# Patient Record
Sex: Female | Born: 1990
Health system: Southern US, Community
[De-identification: ages and names within clinical notes are randomized; demographics above are authoritative.]

## PROBLEM LIST (undated history)

## (undated) ENCOUNTER — Inpatient Hospital Stay (HOSPITAL_COMMUNITY): Payer: Self-pay

## (undated) DIAGNOSIS — K5900 Constipation, unspecified: Secondary | ICD-10-CM

## (undated) DIAGNOSIS — F419 Anxiety disorder, unspecified: Secondary | ICD-10-CM

## (undated) DIAGNOSIS — J45909 Unspecified asthma, uncomplicated: Secondary | ICD-10-CM

## (undated) HISTORY — PX: OTHER SURGICAL HISTORY: SHX169

## (undated) HISTORY — PX: TUBAL LIGATION: SHX77

## (undated) HISTORY — PX: HERNIA REPAIR: SHX51

## (undated) HISTORY — PX: ADENOIDECTOMY: SUR15

---
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
## 2011-02-23 ENCOUNTER — Emergency Department (HOSPITAL_COMMUNITY): Payer: No Typology Code available for payment source

## 2011-02-23 ENCOUNTER — Emergency Department (HOSPITAL_COMMUNITY)
Admission: EM | Admit: 2011-02-23 | Discharge: 2011-02-23 | Disposition: A | Discharge status: Home / Self Care | Payer: No Typology Code available for payment source | Attending: Emergency Medicine | Admitting: Emergency Medicine

## 2011-02-23 DIAGNOSIS — S52009A Unspecified fracture of upper end of unspecified ulna, initial encounter for closed fracture: Secondary | ICD-10-CM | POA: Insufficient documentation

## 2011-02-23 DIAGNOSIS — S0100XA Unspecified open wound of scalp, initial encounter: Secondary | ICD-10-CM | POA: Insufficient documentation

## 2011-02-23 DIAGNOSIS — N949 Unspecified condition associated with female genital organs and menstrual cycle: Secondary | ICD-10-CM | POA: Insufficient documentation

## 2011-02-23 DIAGNOSIS — R0789 Other chest pain: Secondary | ICD-10-CM | POA: Insufficient documentation

## 2011-02-23 DIAGNOSIS — F988 Other specified behavioral and emotional disorders with onset usually occurring in childhood and adolescence: Secondary | ICD-10-CM | POA: Insufficient documentation

## 2011-02-23 DIAGNOSIS — M79609 Pain in unspecified limb: Secondary | ICD-10-CM | POA: Insufficient documentation

## 2011-02-26 ENCOUNTER — Observation Stay (HOSPITAL_COMMUNITY)
Admission: RE | Admit: 2011-02-26 | Discharge: 2011-02-27 | Disposition: A | Discharge status: Home / Self Care | Payer: No Typology Code available for payment source | Source: Ambulatory Visit | Attending: Orthopedic Surgery | Admitting: Orthopedic Surgery

## 2011-02-26 DIAGNOSIS — S52209A Unspecified fracture of shaft of unspecified ulna, initial encounter for closed fracture: Principal | ICD-10-CM | POA: Insufficient documentation

## 2011-02-26 DIAGNOSIS — J45909 Unspecified asthma, uncomplicated: Secondary | ICD-10-CM | POA: Insufficient documentation

## 2011-02-26 DIAGNOSIS — Z01812 Encounter for preprocedural laboratory examination: Secondary | ICD-10-CM | POA: Insufficient documentation

## 2011-02-26 DIAGNOSIS — Y998 Other external cause status: Secondary | ICD-10-CM | POA: Insufficient documentation

## 2011-02-26 LAB — DIFFERENTIAL
Basophils Absolute: 0 10*3/uL (ref 0.0–0.1)
Basophils Relative: 0 % (ref 0–1)
Eosinophils Relative: 6 % — ABNORMAL HIGH (ref 0–5)
Lymphocytes Relative: 36 % (ref 12–46)
Monocytes Absolute: 0.7 10*3/uL (ref 0.1–1.0)

## 2011-02-26 LAB — CBC
HCT: 33.5 % — ABNORMAL LOW (ref 36.0–46.0)
MCHC: 34.3 g/dL (ref 30.0–36.0)
Platelets: 230 10*3/uL (ref 150–400)
RDW: 12.3 % (ref 11.5–15.5)
WBC: 7.7 10*3/uL (ref 4.0–10.5)

## 2011-02-26 LAB — COMPREHENSIVE METABOLIC PANEL
ALT: 22 U/L (ref 0–35)
AST: 16 U/L (ref 0–37)
Albumin: 3.3 g/dL — ABNORMAL LOW (ref 3.5–5.2)
Alkaline Phosphatase: 58 U/L (ref 39–117)
BUN: 9 mg/dL (ref 6–23)
Chloride: 103 mEq/L (ref 96–112)
Potassium: 4.1 mEq/L (ref 3.5–5.1)
Sodium: 135 mEq/L (ref 135–145)
Total Bilirubin: 0.4 mg/dL (ref 0.3–1.2)
Total Protein: 5.8 g/dL — ABNORMAL LOW (ref 6.0–8.3)

## 2011-02-26 LAB — HCG, SERUM, QUALITATIVE: Preg, Serum: NEGATIVE

## 2011-03-01 NOTE — Op Note (Signed)
[name], [name] NO.:  [id number]  MEDICAL RECORD NO.:  [id number]  LOCATION:  5032                         FACILITY:  [facility]  PHYSICIAN:  Dionne Ano. Oluwatoni Rotunno, M.D.DATE OF BIRTH:  [birth year]  DATE OF PROCEDURE:  02/27/2011 DATE OF DISCHARGE:                              OPERATIVE REPORT   PREOPERATIVE DIAGNOSIS:  Displaced ulna shaft fracture, right forearm.  POSTOPERATIVE DIAGNOSIS:  Displaced ulna shaft fracture, right forearm.  PROCEDURE: 1. Open reduction and internal fixation with 8-hole plate and screw     construct ([employer] 3.5 DCP plate), right ulna shaft. 2. Stress radiography.  SURGEON:  Dionne Ano. Amanda Pea, MD  ASSISTANT:  Karie Chimera, PA-C  COMPLICATIONS:  None.  ANESTHESIA:  General.  TOURNIQUET TIME:  Less than an hour.  DRAINS:  None.  INDICATIONS FOR PROCEDURE:  A 20 year old female presents with the above- mentioned diagnosis.  She and her family understand risks and benefits of surgery and desired to proceed.  OPERATION IN DETAIL:  The patient was seen by myself and anesthesia, taken to the operative arena, underwent smooth induction of anesthesia in the form general anesthetic.  She was laid supine, appropriately padded, prepped and draped in the usual sterile fashion with Betadine scrub and paint.  Following this, she then underwent a time-out and preop checklist of course was verified multiple times.  Once this was accomplished and with body parts well padded, we performed an incision of the ulnar border.  Her fracture was located in the proximal third of the shaft.  Dissection was carried down.  Interval between the ECU and FCU was created.  I very carefully mobilized the fracture.  She had a butterfly fragment.  I also very carefully curettaged the fracture site, irrigated it copiously, and then held a plate which I bent with clamps against the surface.  The patient had anatomic reduction and then had the plate applied  in compression mold.  Compression mode was applied and the plate was placed without difficulty.  AP, lateral, and multiple fluoroscopic views were taken, wrist and elbow x-rays were perfect with good alignment of the proximal and distal radioulnar joints.  Excellent fixation was accomplished.  Good, smooth pronation and supination was noted and final copy x-rays were confirmed.  The patient tolerated this well with no complicating features.  Following this, I then irrigated copiously with copious amounts of saline followed by closure of the interval between the ECU and FCU with 3-0 Vicryl and the subcu was closed with 3-0 Vicryl and skin edges were closed with subcuticular Prolene with Steri-Strips.  20 mL of Sensorcaine were placed without difficulty and the patient was placed in long-arm splint.  The patient tolerated this well and there were no complicating features.  She was taken to recovery room in stable condition.  We will continue IV vancomycin given a cephalosporin allergy and close observatory care.  I discussed with the patient do's and dont's, etc., and all questions have been encouraged and answered.  Her parents were very well aware of the plans, risks, and benefits, do's and dont's, and follow up protocol.  This was uncomplicated ORIF.  She tolerated this well with an 8-hole  plate and screw construct from [employer] which created anatomical reduction of this displaced fracture.  She will be admitted overnight and discharged in the morning.     Dionne Ano. Amanda Pea, M.D.     [facility]  D:  02/27/2011  T:  02/27/2011  Job:  [id number]  Electronically Signed by Dominica Severin M.D. on 03/01/2011 07:42:11 AM

## 2011-03-04 ENCOUNTER — Ambulatory Visit (HOSPITAL_COMMUNITY)
Admission: RE | Admit: 2011-03-04 | Discharge: 2011-03-04 | Disposition: A | Discharge status: Home / Self Care | Payer: No Typology Code available for payment source | Source: Ambulatory Visit | Attending: Orthopedic Surgery | Admitting: Orthopedic Surgery

## 2011-03-04 ENCOUNTER — Other Ambulatory Visit (HOSPITAL_COMMUNITY): Payer: Self-pay | Admitting: Orthopedic Surgery

## 2011-03-04 DIAGNOSIS — R51 Headache: Secondary | ICD-10-CM | POA: Insufficient documentation

## 2011-03-04 DIAGNOSIS — M549 Dorsalgia, unspecified: Secondary | ICD-10-CM | POA: Insufficient documentation

## 2011-03-04 DIAGNOSIS — R109 Unspecified abdominal pain: Secondary | ICD-10-CM | POA: Insufficient documentation

## 2011-03-04 MED ORDER — IOHEXOL 300 MG/ML  SOLN
80.0000 mL | Freq: Once | INTRAMUSCULAR | Status: AC | PRN
  Administered 2011-03-04: 80 mL via INTRAVENOUS

## 2011-03-06 ENCOUNTER — Other Ambulatory Visit (HOSPITAL_COMMUNITY): Payer: No Typology Code available for payment source

## 2011-05-25 ENCOUNTER — Ambulatory Visit: Payer: 59 | Attending: Orthopedic Surgery | Admitting: Physical Therapy

## 2011-05-25 DIAGNOSIS — IMO0001 Reserved for inherently not codable concepts without codable children: Secondary | ICD-10-CM | POA: Insufficient documentation

## 2011-05-25 DIAGNOSIS — R5381 Other malaise: Secondary | ICD-10-CM | POA: Insufficient documentation

## 2011-05-25 DIAGNOSIS — M25539 Pain in unspecified wrist: Secondary | ICD-10-CM | POA: Insufficient documentation

## 2011-05-27 ENCOUNTER — Ambulatory Visit: Payer: No Typology Code available for payment source | Admitting: Physical Therapy

## 2011-06-01 ENCOUNTER — Ambulatory Visit: Payer: 59 | Admitting: Physical Therapy

## 2011-06-08 ENCOUNTER — Encounter: Payer: No Typology Code available for payment source | Admitting: Physical Therapy

## 2011-06-09 ENCOUNTER — Ambulatory Visit: Payer: 59 | Attending: Orthopedic Surgery | Admitting: Physical Therapy

## 2011-06-09 DIAGNOSIS — IMO0001 Reserved for inherently not codable concepts without codable children: Secondary | ICD-10-CM | POA: Insufficient documentation

## 2011-06-09 DIAGNOSIS — M25539 Pain in unspecified wrist: Secondary | ICD-10-CM | POA: Insufficient documentation

## 2011-06-09 DIAGNOSIS — R5381 Other malaise: Secondary | ICD-10-CM | POA: Insufficient documentation

## 2011-06-15 ENCOUNTER — Encounter: Payer: No Typology Code available for payment source | Admitting: Physical Therapy

## 2011-06-16 ENCOUNTER — Ambulatory Visit: Payer: 59 | Admitting: *Deleted

## 2011-06-24 ENCOUNTER — Ambulatory Visit: Payer: 59 | Admitting: Physical Therapy

## 2011-07-03 ENCOUNTER — Ambulatory Visit: Payer: 59 | Admitting: Physical Therapy

## 2012-08-29 IMAGING — CR DG ABDOMEN 1V
2 series · 2 of 2 positions shown · non-contrast
Comparison: None.

CLINICAL DATA: Constipation.  Sitz marker study.

ABDOMEN - 1 VIEW

[t abdomen supine (1 of 2)]
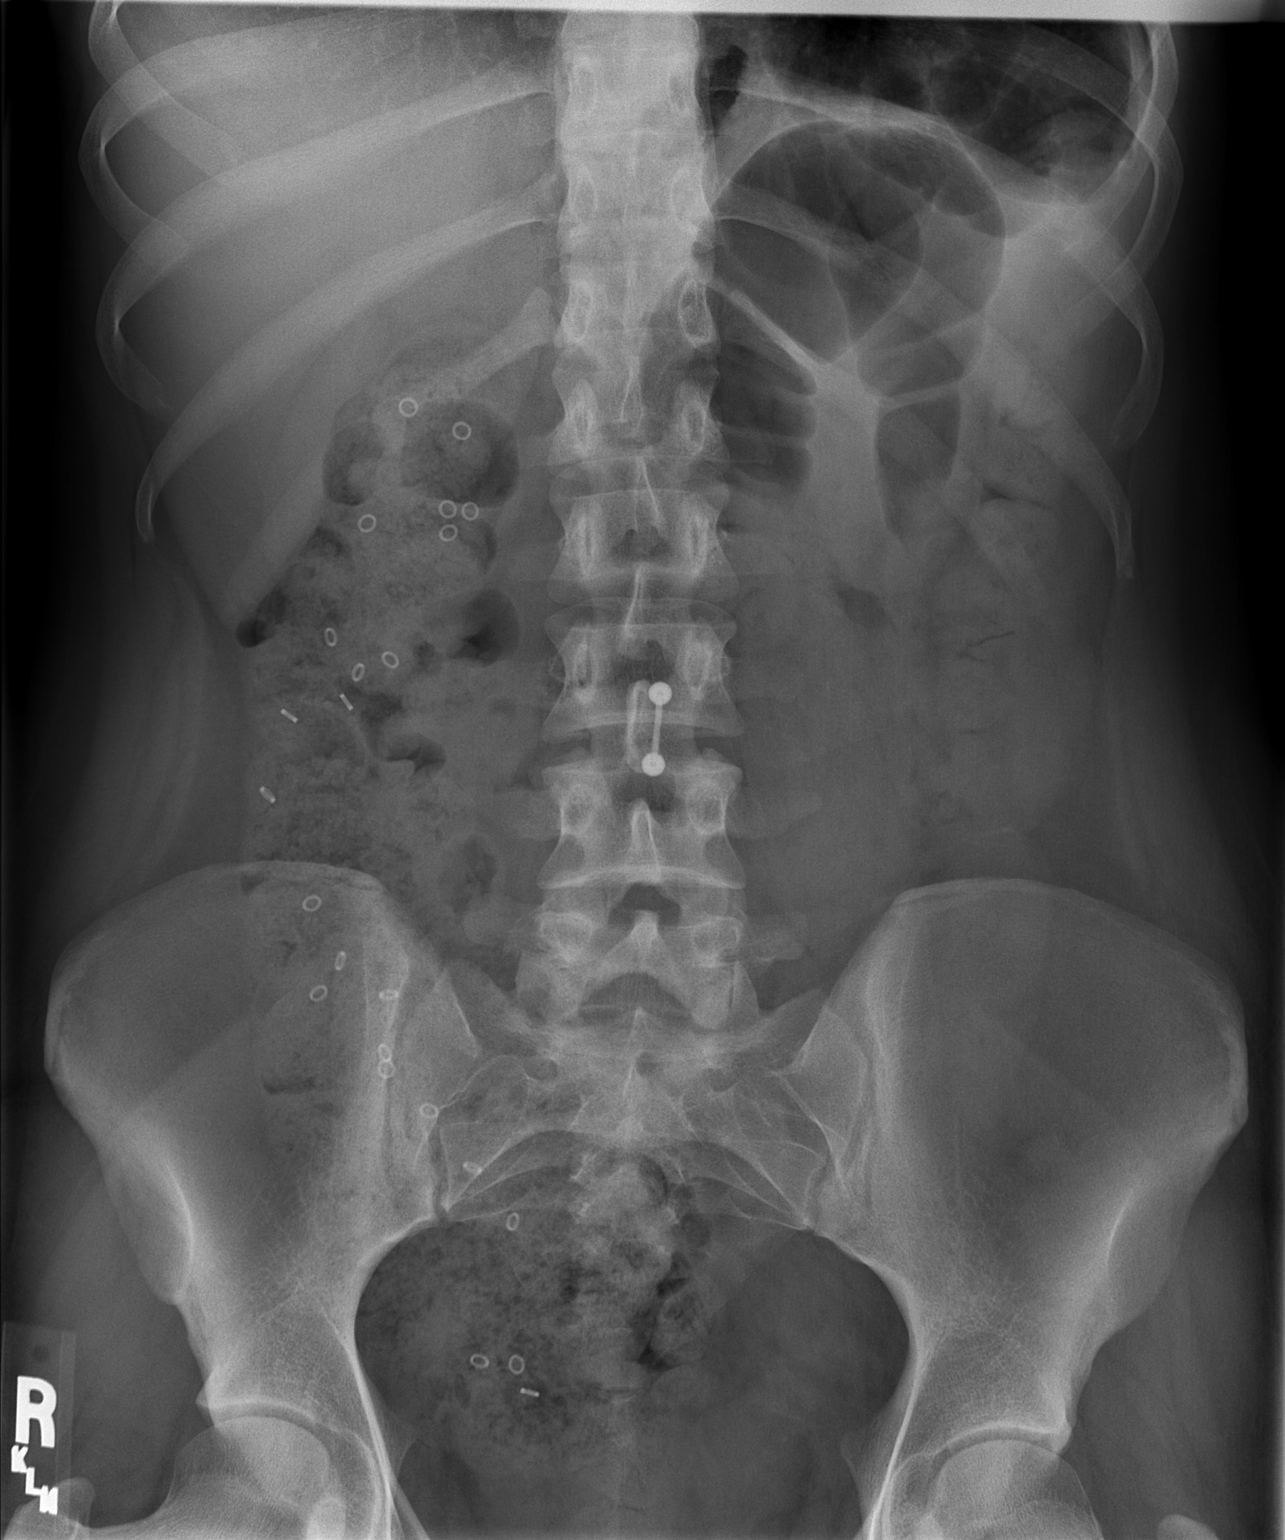

[t abdomen supine (2 of 2)]
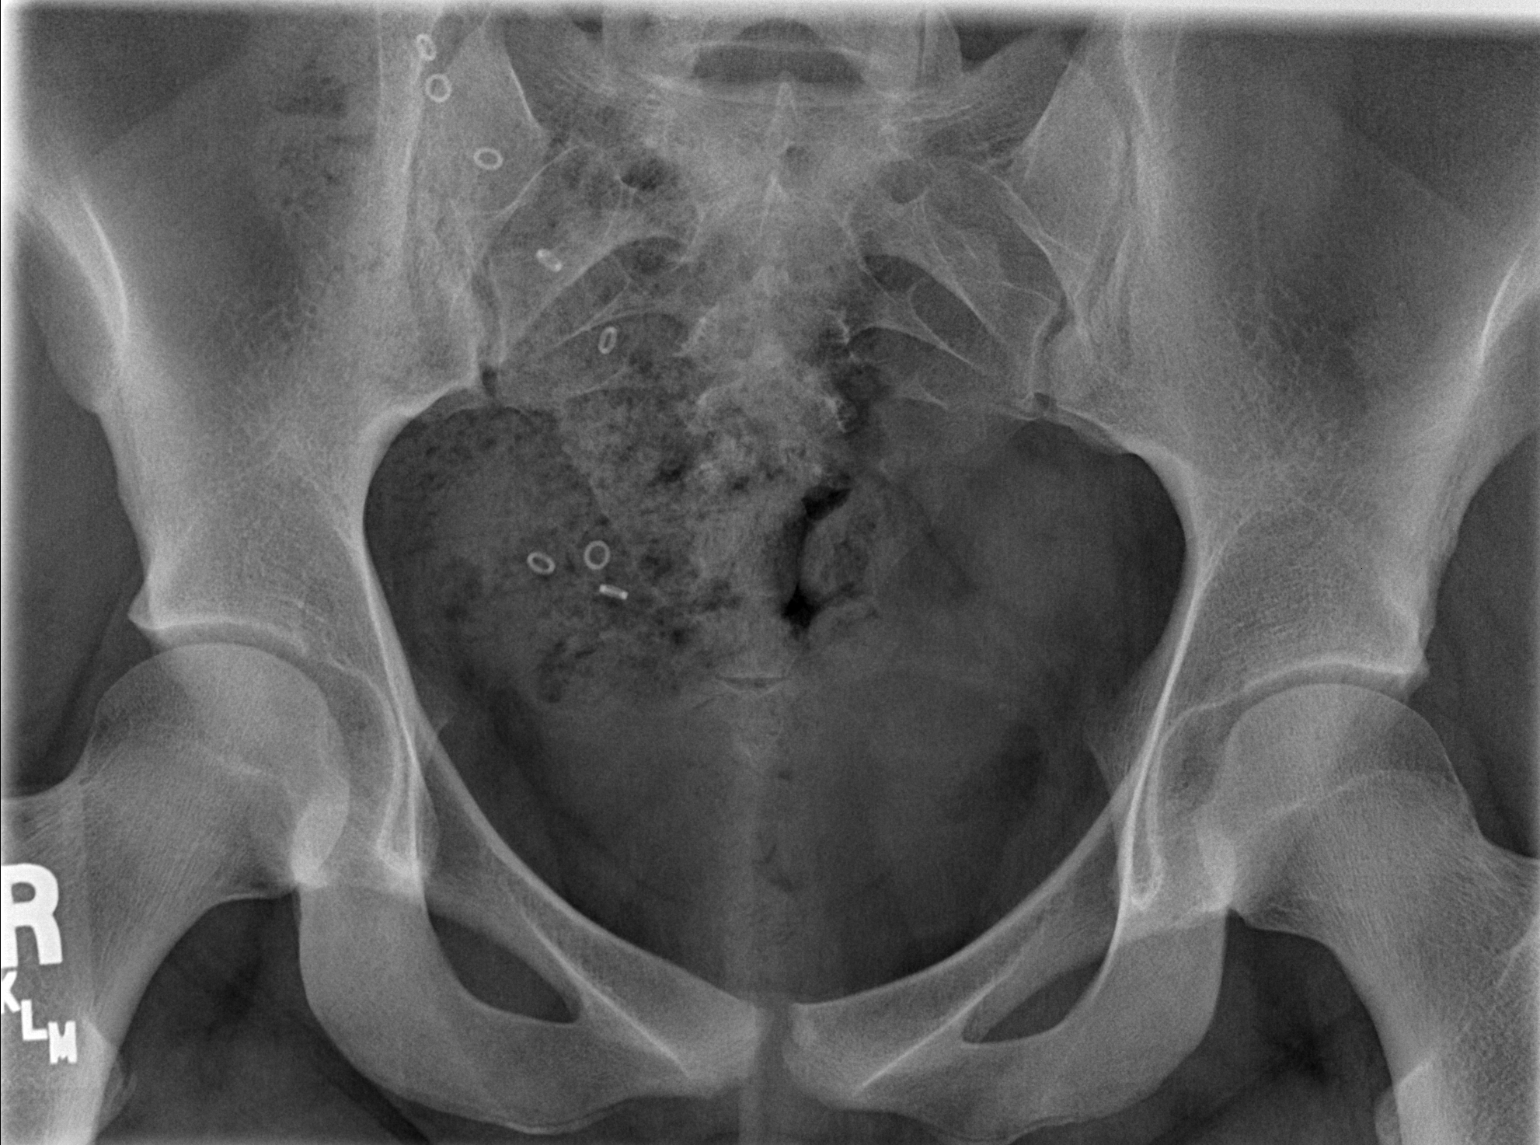

[2 of 2 positions shown; findings below may reference images not displayed]

FINDINGS: Supine abdomen shows 24 sitz markers in the right colon,
extending from the cecal tip to the hepatic flexure.
IMPRESSION: 24 sitz markers visible in the right colon on day two.

## 2013-05-29 ENCOUNTER — Ambulatory Visit: Payer: 59 | Attending: Neurological Surgery | Admitting: Physical Therapy

## 2013-05-29 DIAGNOSIS — M545 Low back pain, unspecified: Secondary | ICD-10-CM | POA: Insufficient documentation

## 2013-05-29 DIAGNOSIS — IMO0001 Reserved for inherently not codable concepts without codable children: Secondary | ICD-10-CM | POA: Insufficient documentation

## 2013-05-29 DIAGNOSIS — R293 Abnormal posture: Secondary | ICD-10-CM | POA: Insufficient documentation

## 2013-05-29 DIAGNOSIS — R5381 Other malaise: Secondary | ICD-10-CM | POA: Insufficient documentation

## 2013-05-30 ENCOUNTER — Ambulatory Visit: Payer: 59 | Admitting: Physical Therapy

## 2013-06-06 ENCOUNTER — Ambulatory Visit: Payer: 59 | Attending: Neurological Surgery | Admitting: Physical Therapy

## 2013-06-06 DIAGNOSIS — R293 Abnormal posture: Secondary | ICD-10-CM | POA: Insufficient documentation

## 2013-06-06 DIAGNOSIS — M545 Low back pain, unspecified: Secondary | ICD-10-CM | POA: Insufficient documentation

## 2013-06-06 DIAGNOSIS — IMO0001 Reserved for inherently not codable concepts without codable children: Secondary | ICD-10-CM | POA: Insufficient documentation

## 2013-06-06 DIAGNOSIS — R5381 Other malaise: Secondary | ICD-10-CM | POA: Insufficient documentation

## 2013-06-12 ENCOUNTER — Ambulatory Visit: Payer: 59 | Admitting: Physical Therapy

## 2013-10-18 ENCOUNTER — Ambulatory Visit (INDEPENDENT_AMBULATORY_CARE_PROVIDER_SITE_OTHER): Payer: 59

## 2013-10-18 VITALS — BP 119/80 | HR 80 | Resp 18

## 2013-10-18 DIAGNOSIS — L600 Ingrowing nail: Secondary | ICD-10-CM

## 2013-10-18 DIAGNOSIS — L03039 Cellulitis of unspecified toe: Secondary | ICD-10-CM

## 2013-10-18 MED ORDER — HYDROCODONE-ACETAMINOPHEN 5-325 MG PO TABS: 1.0000 | ORAL_TABLET | Freq: Four times a day (QID) | ORAL | Status: DC | PRN

## 2013-10-18 MED ORDER — SULFAMETHOXAZOLE-TMP DS 800-160 MG PO TABS: 1.0000 | ORAL_TABLET | Freq: Two times a day (BID) | ORAL | Status: DC

## 2013-10-18 NOTE — Patient Instructions (Signed)

## 2013-10-18 NOTE — Progress Notes (Signed)
   Subjective:    Patient ID: Angel [name], female    DOB: 15-Jul-1990, 23 y.o.   MRN: [id number]  HPI my right big toenail is red and draining and is sore and tender and I have not soaking it and I went to my medical doctor about a week ago and they gave me an ointment and to soak it and this is been going on for about 6 months and I do get them quite often    Review of Systems  All other systems reviewed and are negative.      Objective:   Physical Exam Neurovascular status is intact with pedal pulses palpable DP and PT posterior were for Refill timed 3-4 seconds all digits skin temperature warm turgor normal no edema rubor varicosity edema and erythema and drainage lateral nail fold of the right great toe going on for several weeks has been on antibiotics in the past. Has had nail procedure some point in the past with temporary cut out was done without permanent nail excision. Orthopedic biomechanical exam unremarkable noncontributory rectus foot type no signs of other infection open wounds or ulcerations noted hallux and lesser digits are rectus.       Assessment & Plan:  Assessment this time is paronychia ingrown nail right hallux lateral border medial border is also ingrowing. This time recommendation permanent nail excision and phenol matricectomy medial lateral border of the right hallux local anesthetic block sinister total of 6 cc 50-50 mixture of 2% Xylocaine plain and 0.5% Marcaine plain were utilized Betadine prep was performed the borders were excised phenol matricectomy followed by alcohol wash Betadine ointment and a dry sterile dressing being applied. Patient is given instructions for daily soap and soapy temperature soap and warm water or Epsom salts in warm water recommended Neosporin and Band-Aid dressing daily also prescription for Septra DS and prescription for Vicodin are dispensed followup in 2 weeks for nail check next  Alvan Dameichard Draken Farrior DPM

## 2013-11-03 ENCOUNTER — Ambulatory Visit (INDEPENDENT_AMBULATORY_CARE_PROVIDER_SITE_OTHER): Payer: 59

## 2013-11-03 VITALS — BP 106/66 | HR 72 | Resp 12

## 2013-11-03 DIAGNOSIS — L600 Ingrowing nail: Secondary | ICD-10-CM

## 2013-11-03 NOTE — Progress Notes (Signed)
   Subjective:    Patient ID: Angel [name], female    DOB: 03-11-1991, 23 y.o.   MRN: [id number]  HPI patient presents this time for 2 week followup status post AP nail procedure    Review of Systems new systemic changes or findings noted     Objective:   Physical Exam Neurovascular status is intact pedal pulses palpable. Epicritic and proprioceptive sensations intact there is slight eschar medial lateral nail borders right great toe no discharge or drainage noted slight erythema. No pain or discomfort       Assessment & Plan:  Assessment good postop progress following AP nail right great toe medial lateral border maintain Neosporin and Band-Aid during Rachell dry night discharge to an as-needed basis for any future followup  Alvan Dameichard Sonam Huelsmann DPM

## 2013-11-03 NOTE — Patient Instructions (Signed)

## 2014-10-15 ENCOUNTER — Other Ambulatory Visit: Payer: Self-pay | Admitting: Obstetrics and Gynecology

## 2014-10-15 DIAGNOSIS — N63 Unspecified lump in unspecified breast: Secondary | ICD-10-CM

## 2014-10-15 DIAGNOSIS — N6320 Unspecified lump in the left breast, unspecified quadrant: Secondary | ICD-10-CM

## 2014-10-15 DIAGNOSIS — N6325 Unspecified lump in the left breast, overlapping quadrants: Secondary | ICD-10-CM

## 2014-10-19 ENCOUNTER — Ambulatory Visit
Admission: RE | Admit: 2014-10-19 | Discharge: 2014-10-19 | Disposition: A | Discharge status: Home / Self Care | Payer: 59 | Source: Ambulatory Visit | Attending: Obstetrics and Gynecology | Admitting: Obstetrics and Gynecology

## 2014-10-19 DIAGNOSIS — N6325 Unspecified lump in the left breast, overlapping quadrants: Secondary | ICD-10-CM

## 2014-10-19 DIAGNOSIS — N63 Unspecified lump in unspecified breast: Secondary | ICD-10-CM

## 2014-10-19 DIAGNOSIS — N6320 Unspecified lump in the left breast, unspecified quadrant: Secondary | ICD-10-CM

## 2015-01-09 ENCOUNTER — Encounter (HOSPITAL_BASED_OUTPATIENT_CLINIC_OR_DEPARTMENT_OTHER): Payer: Self-pay

## 2015-01-09 ENCOUNTER — Emergency Department (HOSPITAL_BASED_OUTPATIENT_CLINIC_OR_DEPARTMENT_OTHER)
Admission: EM | Admit: 2015-01-09 | Discharge: 2015-01-09 | Disposition: A | Discharge status: Home / Self Care | Payer: 59 | Attending: Emergency Medicine | Admitting: Emergency Medicine

## 2015-01-09 DIAGNOSIS — W1839XA Other fall on same level, initial encounter: Secondary | ICD-10-CM | POA: Insufficient documentation

## 2015-01-09 DIAGNOSIS — M5441 Lumbago with sciatica, right side: Secondary | ICD-10-CM | POA: Insufficient documentation

## 2015-01-09 DIAGNOSIS — S300XXA Contusion of lower back and pelvis, initial encounter: Secondary | ICD-10-CM | POA: Diagnosis not present

## 2015-01-09 DIAGNOSIS — K5900 Constipation, unspecified: Secondary | ICD-10-CM | POA: Insufficient documentation

## 2015-01-09 DIAGNOSIS — Z720 Tobacco use: Secondary | ICD-10-CM | POA: Diagnosis not present

## 2015-01-09 DIAGNOSIS — Z79899 Other long term (current) drug therapy: Secondary | ICD-10-CM | POA: Insufficient documentation

## 2015-01-09 DIAGNOSIS — Y9289 Other specified places as the place of occurrence of the external cause: Secondary | ICD-10-CM | POA: Diagnosis not present

## 2015-01-09 DIAGNOSIS — J45909 Unspecified asthma, uncomplicated: Secondary | ICD-10-CM | POA: Diagnosis not present

## 2015-01-09 DIAGNOSIS — Y9389 Activity, other specified: Secondary | ICD-10-CM | POA: Diagnosis not present

## 2015-01-09 DIAGNOSIS — S3992XA Unspecified injury of lower back, initial encounter: Secondary | ICD-10-CM | POA: Diagnosis present

## 2015-01-09 DIAGNOSIS — Y998 Other external cause status: Secondary | ICD-10-CM | POA: Diagnosis not present

## 2015-01-09 HISTORY — DX: Unspecified asthma, uncomplicated: J45.909

## 2015-01-09 HISTORY — DX: Constipation, unspecified: K59.00

## 2015-01-09 MED ORDER — NAPROXEN 500 MG PO TABS: 500.0000 mg | ORAL_TABLET | Freq: Two times a day (BID) | ORAL | Status: DC

## 2015-01-09 MED ORDER — METHOCARBAMOL 500 MG PO TABS: 1000.0000 mg | ORAL_TABLET | Freq: Four times a day (QID) | ORAL | Status: DC

## 2015-01-09 NOTE — ED Provider Notes (Signed)
CSN: [id number]     Arrival date & time 01/09/15  1105 History   First MD Initiated Contact with Patient 01/09/15 1202     Chief Complaint  Patient presents with  . Fall     (Consider location/radiation/quality/duration/timing/severity/associated sxs/prior Treatment) HPI Comments: Patient presents with complaint of back pain with radiation into her right leg after a fall yesterday. Patient states that she fell onto a trailer impacting in the area of her right lower back. She had intense pain and developed a bruise. She was able to walk but was in a lot of pain yesterday. Pain is slightly better today and she is moving better. She complains of right-sided sciatica. She has had pain similar to this before but not this severe. Patient denies warning symptoms of back pain including: fecal incontinence, urinary retention or overflow incontinence, night sweats, waking from sleep with back pain, unexplained fevers or weight loss, h/o cancer, IVDU. Patient put ice on the area and some topical medication prior to arrival. No urinary symptoms, specifically hematuria. She states that she needs a note for work today so that she is not fired. Muscle relaxers have helped her when she has had this type pain in the past. The onset of this condition was acute. The course is constant. Aggravating factors: movement. Alleviating factors: none.     The history is provided by the patient.    Past Medical History  Diagnosis Date  . Constipation   . Asthma    Past Surgical History  Procedure Laterality Date  . Arm surgery     No family history on file. History  Substance Use Topics  . Smoking status: Current Every Day Smoker -- 1.00 packs/day    Types: Cigarettes  . Smokeless tobacco: Never Used  . Alcohol Use: Yes   OB History    No data available     Review of Systems  Constitutional: Negative for fever and unexpected weight change.  Gastrointestinal: Negative for constipation.       Negative for  fecal incontinence.   Genitourinary: Negative for dysuria, hematuria, flank pain, vaginal bleeding, vaginal discharge and pelvic pain.       Negative for urinary incontinence or retention.  Musculoskeletal: Positive for back pain.  Skin: Color change: bruise.  Neurological: Negative for weakness and numbness.       Denies saddle paresthesias.      Allergies  Cefzil and Vancomycin  Home Medications   Prior to Admission medications   Medication Sig Start Date End Date Taking? Authorizing Provider  ALBUTEROL IN Inhale into the lungs.   Yes Historical Provider, MD  polyethylene glycol (MIRALAX / GLYCOLAX) packet Take 17 g by mouth daily.    Historical Provider, MD   BP 105/67 mmHg  Pulse 79  Temp(Src) 98.3 F (36.8 C) (Oral)  Resp 18  Ht 5\' 5"  (1.651 m)  Wt 180 lb (81.647 kg)  BMI 29.95 kg/m2  SpO2 95%  LMP 01/07/2015   Physical Exam  Constitutional: She appears well-developed and well-nourished.  HENT:  Head: Normocephalic and atraumatic.  Eyes: Conjunctivae are normal.  Neck: Normal range of motion. Neck supple.  Pulmonary/Chest: Effort normal.  Abdominal: Soft. There is no tenderness. There is no CVA tenderness.  Musculoskeletal: Normal range of motion.       Cervical back: She exhibits normal range of motion, no tenderness and no bony tenderness.       Thoracic back: She exhibits normal range of motion, no tenderness and no bony tenderness.  Lumbar back: She exhibits tenderness, pain and spasm. She exhibits normal range of motion and no bony tenderness.       Back:  No step-off noted with palpation of spine.   Neurological: She is alert. She has normal strength and normal reflexes. No sensory deficit.  5/5 strength in entire lower extremities bilaterally. No sensation deficit.   Skin: Skin is warm and dry. No rash noted.  Psychiatric: She has a normal mood and affect.  Nursing note and vitals reviewed.   ED Course  Procedures (including critical care  time) Labs Review Labs Reviewed - No data to display  Imaging Review No results found.   EKG Interpretation None       12:20 PM Patient seen and examined.   Vital signs reviewed and are as follows: BP 105/67 mmHg  Pulse 79  Temp(Src) 98.3 F (36.8 C) (Oral)  Resp 18  Ht  (1.651 m)  Wt 180 lb (81.647 kg)  BMI 29.95 kg/m2  SpO2 95%  LMP 01/07/2015  No red flag s/s of low back pain. Patient was counseled on back pain precautions and told to do activity as tolerated but do not lift, push, or pull heavy objects more than 10 pounds for the next week.  Patient counseled to use ice or heat on back for no longer than 15 minutes every hour.   Patient prescribed muscle relaxer and counseled on proper use of muscle relaxant medication.    Urged patient not to drink alcohol, drive, or perform any other activities that requires focus while taking this medication.  Patient urged to follow-up with PCP if pain does not improve with treatment and rest or if pain becomes recurrent. Urged to return with worsening severe pain, loss of bowel or bladder control, trouble walking.   The patient verbalizes understanding and agrees with the plan.   MDM   Final diagnoses:  Contusion of lower back, initial encounter  Right-sided low back pain with right-sided sciatica   Patient with back pain after a fall. No neurological deficits. No bony tenderness.Superficial bruise noted. No other concerning symptoms/findings for retroperitoneal hematoma. Patient is ambulatory. No warning symptoms of back pain including: fecal incontinence, urinary retention or overflow incontinence, night sweats, waking from sleep with back pain, unexplained fevers or weight loss, h/o cancer, IVDU, recent trauma. No concern for cauda equina, epidural abscess, or other serious cause of back pain. Conservative measures such as rest, ice/heat and pain medicine indicated with PCP follow-up if no improvement with conservative  management.      Renne Crigler, PA-C 01/09/15 1311  Blake Divine, MD 01/09/15 [phone]

## 2015-01-09 NOTE — ED Notes (Signed)
Pt states she fell onto trailer last night-pain to right flank and right leg-upon arrival to triage pt requesting RTW note to be faxed to employer-pt advised if EDP feels she needs to out of work a paper copy of note will be given at d/c

## 2015-01-09 NOTE — ED Notes (Signed)
Pt ambulating around ER with quick, steady gait and in NAD. Pt asked if she could go smoke. I advised her that she was getting ready to be taken to a room and if she left, she would be skipped over. Pt left ER anyway.

## 2015-01-09 NOTE — Discharge Instructions (Signed)
Please read and follow all provided instructions.  Your diagnoses today include:  1. Contusion of lower back, initial encounter   2. Right-sided low back pain with right-sided sciatica     Tests performed today include:  Vital signs - see below for your results today  Medications prescribed:   Robaxin (methocarbamol) - muscle relaxer medication  DO NOT drive or perform any activities that require you to be awake and alert because this medicine can make you drowsy.    Naproxen - anti-inflammatory pain medication  Do not exceed 500mg  naproxen every 12 hours, take with food  You have been prescribed an anti-inflammatory medication or NSAID. Take with food. Take smallest effective dose for the shortest duration needed for your pain. Stop taking if you experience stomach pain or vomiting.   Take any prescribed medications only as directed.  Home care instructions:   Follow any educational materials contained in this packet  Please rest, use ice or heat on your back for the next several days  Do not lift, push, pull anything more than 10 pounds for the next week  Follow-up instructions: Please follow-up with your primary care provider in the next 1 week for further evaluation of your symptoms.   Return instructions:  SEEK IMMEDIATE MEDICAL ATTENTION IF YOU HAVE:  New numbness, tingling, weakness, or problem with the use of your arms or legs  Severe back pain not relieved with medications  Loss control of your bowels or bladder  Increasing pain in any areas of the body (such as chest or abdominal pain)  Shortness of breath, dizziness, or fainting.   Worsening nausea (feeling sick to your stomach), vomiting, fever, or sweats  Any other emergent concerns regarding your health   Additional Information:  Your vital signs today were: BP 105/67 mmHg   Pulse 79   Temp(Src) 98.3 F (36.8 C) (Oral)   Resp 18   Ht 5\' 5"  (1.651 m)   Wt 180 lb (81.647 kg)   BMI 29.95 kg/m2    SpO2 95%   LMP 01/07/2015 If your blood pressure (BP) was elevated above 135/85 this visit, please have this repeated by your doctor within one month. --------------

## 2015-05-04 ENCOUNTER — Emergency Department (HOSPITAL_COMMUNITY)
Admission: EM | Admit: 2015-05-04 | Discharge: 2015-05-04 | Disposition: A | Discharge status: Home / Self Care | Payer: 59 | Attending: Emergency Medicine | Admitting: Emergency Medicine

## 2015-05-04 ENCOUNTER — Emergency Department (HOSPITAL_COMMUNITY): Payer: 59

## 2015-05-04 ENCOUNTER — Encounter (HOSPITAL_COMMUNITY): Payer: Self-pay | Admitting: Family Medicine

## 2015-05-04 DIAGNOSIS — S61313A Laceration without foreign body of left middle finger with damage to nail, initial encounter: Secondary | ICD-10-CM | POA: Diagnosis present

## 2015-05-04 DIAGNOSIS — Z79899 Other long term (current) drug therapy: Secondary | ICD-10-CM | POA: Insufficient documentation

## 2015-05-04 DIAGNOSIS — Z791 Long term (current) use of non-steroidal anti-inflammatories (NSAID): Secondary | ICD-10-CM | POA: Diagnosis not present

## 2015-05-04 DIAGNOSIS — Z87891 Personal history of nicotine dependence: Secondary | ICD-10-CM | POA: Insufficient documentation

## 2015-05-04 DIAGNOSIS — Y9289 Other specified places as the place of occurrence of the external cause: Secondary | ICD-10-CM | POA: Insufficient documentation

## 2015-05-04 DIAGNOSIS — S61219A Laceration without foreign body of unspecified finger without damage to nail, initial encounter: Secondary | ICD-10-CM

## 2015-05-04 DIAGNOSIS — Y9389 Activity, other specified: Secondary | ICD-10-CM | POA: Insufficient documentation

## 2015-05-04 DIAGNOSIS — J45909 Unspecified asthma, uncomplicated: Secondary | ICD-10-CM | POA: Diagnosis not present

## 2015-05-04 DIAGNOSIS — Y990 Civilian activity done for income or pay: Secondary | ICD-10-CM | POA: Insufficient documentation

## 2015-05-04 DIAGNOSIS — W260XXA Contact with knife, initial encounter: Secondary | ICD-10-CM | POA: Insufficient documentation

## 2015-05-04 DIAGNOSIS — K5900 Constipation, unspecified: Secondary | ICD-10-CM | POA: Diagnosis not present

## 2015-05-04 MED ORDER — ACETAMINOPHEN 325 MG PO TABS
650.0000 mg | ORAL_TABLET | Freq: Once | ORAL | Status: AC
  Administered 2015-05-04: 650 mg via ORAL
  Filled 2015-05-04: qty 2

## 2015-05-04 MED ORDER — BACITRACIN ZINC 500 UNIT/GM EX OINT
TOPICAL_OINTMENT | Freq: Two times a day (BID) | CUTANEOUS | Status: DC
  Filled 2015-05-04: qty 0.9

## 2015-05-04 NOTE — Discharge Instructions (Signed)
Clean the wound with warm water and antibacterial soap. Apply neosporin ointment and dressing twice daily. Return for worsening symptoms. Take tylenol for pain.

## 2015-05-04 NOTE — ED Provider Notes (Signed)
CSN: [id number]     Arrival date & time 05/04/15  1501 History   First MD Initiated Contact with Patient 05/04/15 1517     Chief Complaint  Patient presents with  . Laceration     (Consider location/radiation/quality/duration/timing/severity/associated sxs/prior Treatment) Patient is a 24 y.o. female presenting with skin laceration. The history is provided by the patient.  Laceration Location:  Hand Hand laceration location:  L finger Quality: avulsion   Bleeding: controlled   Time since incident:  2 hours Laceration mechanism:  Knife Pain details:    Quality:  Sharp and throbbing   Severity:  Moderate   Timing:  Constant   Progression:  Worsening Relieved by:  None tried Worsened by:  Movement Ineffective treatments:  None tried Tetanus status:  Up to date  Angel [name] is a 24 y.o. female who presents to the ED with a laceration to the left middle finger. She reports using a slicer at work and cut her finger.  Past Medical History  Diagnosis Date  . Constipation   . Asthma    Past Surgical History  Procedure Laterality Date  . Arm surgery     History reviewed. No pertinent family history. Social History  Substance Use Topics  . Smoking status: Former Smoker -- 1.00 packs/day    Types: Cigarettes  . Smokeless tobacco: Never Used  . Alcohol Use: No   OB History    Gravida Para Term Preterm AB TAB SAB Ectopic Multiple Living   1              Review of Systems  Skin:       Laceration finger   All other systems negative   Allergies  Cefzil and Vancomycin  Home Medications   Prior to Admission medications   Medication Sig Start Date End Date Taking? Authorizing Provider  ALBUTEROL IN Inhale into the lungs.    Historical Provider, MD  methocarbamol (ROBAXIN) 500 MG tablet Take 2 tablets (1,000 mg total) by mouth 4 (four) times daily. 01/09/15   Renne CriglerJoshua Geiple, PA-C  naproxen (NAPROSYN) 500 MG tablet Take 1 tablet (500 mg total) by mouth 2 (two) times  daily. 01/09/15   Renne CriglerJoshua Geiple, PA-C  polyethylene glycol (MIRALAX / GLYCOLAX) packet Take 17 g by mouth daily.    Historical Provider, MD   BP 98/57 mmHg  Pulse 6  Resp 20  SpO2 99%  LMP 03/07/2015 Physical Exam  Constitutional: She is oriented to person, place, and time. She appears well-developed and well-nourished.  HENT:  Head: Normocephalic and atraumatic.  Eyes: Conjunctivae and EOM are normal.  Neck: Normal range of motion. Neck supple.  Cardiovascular: Normal rate.   Pulmonary/Chest: Effort normal.  Musculoskeletal:       Left hand: She exhibits laceration. Normal strength noted.       Hands: Avulsion laceration to the dorsum of the left middle finger including the nail.   Neurological: She is alert and oriented to person, place, and time. No cranial nerve deficit.  Skin: Skin is warm and dry.  Psychiatric: She has a normal mood and affect. Her behavior is normal.  Nursing note and vitals reviewed.   ED Course  Procedures (including critical care time) Labs Review Labs Reviewed - No data to display  Imaging Review Dg Finger Middle Left  05/04/2015  CLINICAL DATA:  Laceration to tip of left middle finger today, first trimester pregnancy, patient was shielded and informed of risks EXAM: LEFT MIDDLE FINGER 2+V COMPARISON:  None.  FINDINGS: There is no evidence of fracture or dislocation. There is no evidence of arthropathy or other focal bone abnormality. Soft tissues are unremarkable. IMPRESSION: Negative. Electronically Signed   By: Esperanza Heir M.D.   On: 05/04/2015 16:15    MDM  24 y.o. female with avulsion laceration of the left middle finger that occurred at work today. Stable for d/c without focal neuro deficits. Bacitracin ointment and dressing applied. She will return as needed for any problems.   Final diagnoses:  Finger laceration, initial encounter       Janne Napoleon, NP 05/04/15 1705  Gilda Crease, MD 05/05/15 [phone]

## 2015-05-04 NOTE — ED Notes (Signed)
Pt here with laceration to middle left finger. sts she was using a slicer at work and cut finger.

## 2015-05-07 ENCOUNTER — Other Ambulatory Visit: Payer: 59

## 2015-05-08 LAB — OB RESULTS CONSOLE ABO/RH

## 2015-05-15 LAB — OB RESULTS CONSOLE RPR: RPR: NONREACTIVE

## 2015-05-15 LAB — OB RESULTS CONSOLE GC/CHLAMYDIA
Chlamydia: NEGATIVE
GC PROBE AMP, GENITAL: NEGATIVE

## 2015-05-15 LAB — OB RESULTS CONSOLE HEPATITIS B SURFACE ANTIGEN: Hepatitis B Surface Ag: NEGATIVE

## 2015-05-15 LAB — OB RESULTS CONSOLE ANTIBODY SCREEN: ANTIBODY SCREEN: NEGATIVE

## 2015-05-15 LAB — OB RESULTS CONSOLE ABO/RH: RH TYPE: POSITIVE

## 2015-05-15 LAB — OB RESULTS CONSOLE HIV ANTIBODY (ROUTINE TESTING): HIV: NONREACTIVE

## 2015-05-15 LAB — OB RESULTS CONSOLE RUBELLA ANTIBODY, IGM: Rubella: IMMUNE

## 2015-07-19 DIAGNOSIS — Z3400 Encounter for supervision of normal first pregnancy, unspecified trimester: Secondary | ICD-10-CM | POA: Diagnosis not present

## 2015-07-19 DIAGNOSIS — Z36 Encounter for antenatal screening of mother: Secondary | ICD-10-CM | POA: Diagnosis not present

## 2015-07-19 DIAGNOSIS — Z412 Encounter for routine and ritual male circumcision: Secondary | ICD-10-CM | POA: Diagnosis not present

## 2015-07-19 DIAGNOSIS — Z3A18 18 weeks gestation of pregnancy: Secondary | ICD-10-CM | POA: Diagnosis not present

## 2015-07-19 DIAGNOSIS — O30042 Twin pregnancy, dichorionic/diamniotic, second trimester: Secondary | ICD-10-CM | POA: Diagnosis not present

## 2015-08-14 DIAGNOSIS — Z3400 Encounter for supervision of normal first pregnancy, unspecified trimester: Secondary | ICD-10-CM | POA: Diagnosis not present

## 2015-08-14 DIAGNOSIS — O30042 Twin pregnancy, dichorionic/diamniotic, second trimester: Secondary | ICD-10-CM | POA: Diagnosis not present

## 2015-08-14 DIAGNOSIS — O350XX0 Maternal care for (suspected) central nervous system malformation in fetus, not applicable or unspecified: Secondary | ICD-10-CM | POA: Diagnosis not present

## 2015-08-14 DIAGNOSIS — Z3A22 22 weeks gestation of pregnancy: Secondary | ICD-10-CM | POA: Diagnosis not present

## 2015-08-26 DIAGNOSIS — M79671 Pain in right foot: Secondary | ICD-10-CM | POA: Diagnosis not present

## 2015-08-26 DIAGNOSIS — M25571 Pain in right ankle and joints of right foot: Secondary | ICD-10-CM | POA: Diagnosis not present

## 2015-08-28 ENCOUNTER — Inpatient Hospital Stay (HOSPITAL_COMMUNITY)
Admission: AD | Admit: 2015-08-28 | Discharge: 2015-08-28 | Disposition: A | Discharge status: Home / Self Care | Payer: 59 | Source: Ambulatory Visit | Attending: Obstetrics and Gynecology | Admitting: Obstetrics and Gynecology

## 2015-08-28 ENCOUNTER — Encounter (HOSPITAL_COMMUNITY): Payer: Self-pay | Admitting: Advanced Practice Midwife

## 2015-08-28 DIAGNOSIS — W19XXXA Unspecified fall, initial encounter: Secondary | ICD-10-CM | POA: Insufficient documentation

## 2015-08-28 DIAGNOSIS — Z9889 Other specified postprocedural states: Secondary | ICD-10-CM | POA: Insufficient documentation

## 2015-08-28 DIAGNOSIS — Z3A24 24 weeks gestation of pregnancy: Secondary | ICD-10-CM | POA: Diagnosis not present

## 2015-08-28 DIAGNOSIS — Z3A22 22 weeks gestation of pregnancy: Secondary | ICD-10-CM | POA: Diagnosis not present

## 2015-08-28 DIAGNOSIS — W1839XA Other fall on same level, initial encounter: Secondary | ICD-10-CM

## 2015-08-28 DIAGNOSIS — J45909 Unspecified asthma, uncomplicated: Secondary | ICD-10-CM | POA: Diagnosis not present

## 2015-08-28 DIAGNOSIS — O4702 False labor before 37 completed weeks of gestation, second trimester: Secondary | ICD-10-CM | POA: Diagnosis not present

## 2015-08-28 DIAGNOSIS — Z79899 Other long term (current) drug therapy: Secondary | ICD-10-CM | POA: Diagnosis not present

## 2015-08-28 DIAGNOSIS — O9A212 Injury, poisoning and certain other consequences of external causes complicating pregnancy, second trimester: Secondary | ICD-10-CM

## 2015-08-28 DIAGNOSIS — Z888 Allergy status to other drugs, medicaments and biological substances status: Secondary | ICD-10-CM | POA: Insufficient documentation

## 2015-08-28 DIAGNOSIS — R109 Unspecified abdominal pain: Secondary | ICD-10-CM | POA: Insufficient documentation

## 2015-08-28 DIAGNOSIS — S6990XA Unspecified injury of unspecified wrist, hand and finger(s), initial encounter: Secondary | ICD-10-CM

## 2015-08-28 DIAGNOSIS — Z87891 Personal history of nicotine dependence: Secondary | ICD-10-CM | POA: Diagnosis not present

## 2015-08-28 DIAGNOSIS — Y92009 Unspecified place in unspecified non-institutional (private) residence as the place of occurrence of the external cause: Secondary | ICD-10-CM

## 2015-08-28 LAB — URINALYSIS, ROUTINE W REFLEX MICROSCOPIC
BILIRUBIN URINE: NEGATIVE
GLUCOSE, UA: NEGATIVE mg/dL
Nitrite: NEGATIVE
PH: 5.5 (ref 5.0–8.0)
Protein, ur: NEGATIVE mg/dL
Specific Gravity, Urine: 1.025 (ref 1.005–1.030)

## 2015-08-28 LAB — URINE MICROSCOPIC-ADD ON

## 2015-08-28 MED ORDER — TERBUTALINE SULFATE 1 MG/ML IJ SOLN
0.2500 mg | Freq: Once | INTRAMUSCULAR | Status: AC
  Administered 2015-08-28: 0.25 mg via SUBCUTANEOUS
  Filled 2015-08-28: qty 1

## 2015-08-28 NOTE — MAU Note (Signed)
Attempted to apply FHM on patient. Babies very active. Provider notified that I would keep trying and toco would be applied.

## 2015-08-28 NOTE — MAU Provider Note (Signed)
Chief Complaint:  No chief complaint on file.   First Provider Initiated Contact with Patient 08/28/15 1554     HPI  HPI: Angel [name] is a 25 y.o. G1P0 at 37w4dwho presents to maternity admissions reporting abdominal cramping since falling on Saturday.  States was walking on a cobblestone walkway at home and twisted her ankle and fell, landing on her hands outstretched.  Did not strike her abdomen. She reports good fetal movement, denies LOF, vaginal bleeding, vaginal itching/burning, urinary symptoms, h/a, dizziness, n/v, diarrhea, constipation or fever/chills.  She denies headache, visual changes or abdominal pain.   Past Medical History: Past Medical History  Diagnosis Date  . Constipation   . Asthma     Past obstetric history: OB History  Gravida Para Term Preterm AB SAB TAB Ectopic Multiple Living  1             # Outcome Date GA Lbr Len/2nd Weight Sex Delivery Anes PTL Lv  1 Current               Past Surgical History: Past Surgical History  Procedure Laterality Date  . Arm surgery      Family History: History reviewed. No pertinent family history.  Social History: Social History  Substance Use Topics  . Smoking status: Former Smoker -- 1.00 packs/day    Types: Cigarettes  . Smokeless tobacco: Never Used  . Alcohol Use: No    Allergies:  Allergies  Allergen Reactions  . Cefzil [Cefprozil] Shortness Of Breath and Swelling  . Vancomycin Itching and Rash    Meds:  Prescriptions prior to admission  Medication Sig Dispense Refill Last Dose  . acetaminophen (TYLENOL) 325 MG tablet Take 325 mg by mouth every 6 (six) hours as needed for mild pain.   08/27/2015 at Unknown time  . albuterol (PROVENTIL HFA;VENTOLIN HFA) 108 (90 Base) MCG/ACT inhaler Inhale 2 puffs into the lungs every 6 (six) hours as needed for wheezing or shortness of breath.   [facility]cue  . cyclobenzaprine (FLEXERIL) 10 MG tablet Take 10 mg by mouth 3 (three) times daily as needed for muscle  spasms.   Past Month at Unknown time  . polyethylene glycol (MIRALAX / GLYCOLAX) packet Take 17 g by mouth daily.   Past Week at Unknown time  . Prenatal Vit-Fe Fumarate-FA (PRENATAL MULTIVITAMIN) TABS tablet Take 1 tablet by mouth daily at 12 noon.   08/28/2015 at Unknown time    I have reviewed patient's Past Medical Hx, Surgical Hx, Family Hx, Social Hx, medications and allergies.   ROS:  Review of Systems  Constitutional: Negative for fever, chills, appetite change and fatigue.  Respiratory: Negative for shortness of breath.   Genitourinary: Positive for pelvic pain (with contractions). Negative for dysuria, vaginal bleeding and vaginal discharge.  Musculoskeletal: Negative for myalgias and back pain.  Neurological: Negative for dizziness, seizures, numbness and headaches.  Psychiatric/Behavioral: The patient is not nervous/anxious.    Other systems negative  Physical Exam  Patient Vitals for the past 24 hrs:  BP Temp Temp src Pulse Resp  08/28/15 1815 114/58 mmHg 98.6 F (37 C) Oral 94 16  08/28/15 1532 139/71 mmHg - - 105 18   Constitutional: Well-developed, well-nourished female in no acute distress.  Cardiovascular: normal rate and rhythm Respiratory: normal effort, clear to auscultation bilaterally GI: Abd soft, non-tender, gravid appropriate for gestational age.   No rebound or guarding. MS: Extremities nontender, no edema, normal ROM Neurologic: Alert and oriented x 4.  GU: Neg  CVAT.  PELVIC EXAM: Cervix firm, posterior, neg CMT, uterus nontender, Fundal Height consistent with dates, adnexa without tenderness, enlargement, or mass  Dilation: Closed Effacement (%): Thick Station:  (high) Exam by:: V Standard CNM  FHT:  Baseline 140 , moderate variability, accelerations present, no decelerations Contractions: q 2-4 mins   Labs: Results for orders placed or performed during the hospital encounter of 08/28/15 (from the past 24 hour(s))  Urinalysis, Routine w reflex  microscopic (not at [facility])     Status: Abnormal   Collection Time: 08/28/15  3:42 PM  Result Value Ref Range   Color, Urine YELLOW YELLOW   APPearance CLOUDY (A) CLEAR   Specific Gravity, Urine 1.025 1.005 - 1.030   pH 5.5 5.0 - 8.0   Glucose, UA NEGATIVE NEGATIVE mg/dL   Hgb urine dipstick TRACE (A) NEGATIVE   Bilirubin Urine NEGATIVE NEGATIVE   Ketones, ur >80 (A) NEGATIVE mg/dL   Protein, ur NEGATIVE NEGATIVE mg/dL   Nitrite NEGATIVE NEGATIVE   Leukocytes, UA LARGE (A) NEGATIVE  Urine microscopic-add on     Status: Abnormal   Collection Time: 08/28/15  3:42 PM  Result Value Ref Range   Squamous Epithelial / LPF 6-30 (A) NONE SEEN   WBC, UA 6-30 0 - 5 WBC/hpf   RBC / HPF 0-5 0 - 5 RBC/hpf   Bacteria, UA MANY (A) NONE SEEN      Imaging:  No results found.  MAU Course/MDM: I have ordered labs and reviewed results.  NST reviewed Initially RN had not put toco on patient, who denied contractions. I placed toco and found her to be contracting. She then admitted she had been having lower abdominal cramping since the fall. Denies leaking or bleeding.  Consult Dr Vincente Poli with presentation, exam findings and test results.  Treatments in MAU included Terbutaline x 2 was required to stop contractions.  We also pushed PO fluids.  Pt states she hardly ever drinks much.  Has also not eaten today.    Assessment: 1. Fall at home, initial encounter   2.    Preterm contractions with no change in cervix, stopped after 2 doses of Terbutaline  Plan: Discharge home Preterm  Labor precautions and fetal kick counts Discussed hydration in detail with tips given to make it more palatable for her. Follow up in Office for prenatal visits and recheck of cervix  Follow-up Information    Schedule an appointment as soon as possible for a visit with Jeani Hawking, MD.   Specialty:  Obstetrics and Gynecology   Contact information:   [address] [place]  [address] [phone]        Medication List    TAKE these medications        acetaminophen 325 MG tablet  Commonly known as:  TYLENOL  Take 325 mg by mouth every 6 (six) hours as needed for mild pain.     albuterol 108 (90 Base) MCG/ACT inhaler  Commonly known as:  PROVENTIL HFA;VENTOLIN HFA  Inhale 2 puffs into the lungs every 6 (six) hours as needed for wheezing or shortness of breath.     cyclobenzaprine 10 MG tablet  Commonly known as:  FLEXERIL  Take 10 mg by mouth 3 (three) times daily as needed for muscle spasms.     polyethylene glycol packet  Commonly known as:  MIRALAX / GLYCOLAX  Take 17 g by mouth daily.     prenatal multivitamin Tabs tablet  Take 1 tablet by mouth  daily at 12 noon.       Pt stable at time of discharge. Encouraged to return here or to other Urgent Care/ED if she develops worsening of symptoms, increase in pain, fever, or other concerning symptoms.      Wynelle Bourgeois CNM, MSN Certified Nurse-Midwife 08/28/2015 6:20 PM

## 2015-08-28 NOTE — Discharge Instructions (Signed)
What Do I Need to Know About Injuries During Pregnancy?  Your baby is protected in the womb (uterus) by a sac filled with fluid (amniotic sac). Your baby can be harmed if there is direct, high-impact trauma to your abdomen and pelvis. This type of trauma can result in tearing of your uterus, the placenta pulling away from the wall of the uterus (placenta abruption), or the amniotic sac breaking open (rupture of membranes). These injuries can decrease or stop the blood supply to your baby or cause you to go into labor earlier than expected. Minor falls and low-impact automobile accidents do not usually harm your baby, even if they do minimally harm you. WHAT KIND OF INJURIES CAN AFFECT MY PREGNANCY? The most common causes of injury include:  Falls. Falls are more common in the second and third trimester of the pregnancy. Factors that increase your risk of falling include:  Increase in your weight.  The change in your center of gravity.  Tripping over an object that cannot be seen.  Increased looseness (laxity) of your ligaments resulting in less coordinated movements (you may feel clumsy).  Falling during high-risk activities like horseback riding or skiing.  Automobile accidents. It is important to wear your seat belt properly, with the lap belt below your abdomen, and always practice safe driving.  Domestic violence or assault.  Burns (Met[employer]).    The most common causes of injury to the pregnant woman include:  Injuries that cause severe bleeding, shock, and loss of blood flow to major organs.  Head and neck injuries that result in severe brain or spinal damage.  Chest trauma that can cause direct injury to the heart and lungs or any injury that affects the area enclosed by the ribs. Trauma to this area can result in cardiorespiratory arrest. WHAT CAN I DO TO PROTECT MYSELF AND MY BABY FROM INJURY WHILE I AM PREGNANT?  Remove slippery rugs and loose objects on the  floor that increase your risk of tripping.  Avoid walking on wet or slippery floors.  Wear comfortable shoes that have a good grip on the sole. Do not wear high-heeled shoes.  Always wear your seat belt properly, with the lap belt below your abdomen, and always practice safe driving. Do not ride on a motorcycle while pregnant.  Do not participate in high-impact activities or sports.  Avoid fires, starting fires, lifting heavy pots of boiling or hot liquids, and fixing electrical problems.  Only take over-the-counter or prescription medicines for pain, fever, or discomfort as directed by your health care provider.  Know your blood type and the father's blood type in case you develop vaginal bleeding or experience an injury for which a blood transfusion may be necessary.  Call your local emergency services (911 in the [place] if you are a victim of domestic violence or assault. Spousal abuse can be a significant cause of trauma during pregnancy. For help and support, contact the [employer]. WHEN SHOULD I SEEK IMMEDIATE MEDICAL CARE?   You fall on your abdomen or experience any high-force accident or injury.  You have been assaulted (domestic or otherwise).  You have been in a car accident.  You develop vaginal bleeding.  You develop fluid leaking from the vagina.  You develop uterine contractions (pelvic cramping, pain, or significant low back pain).  You become weak or faint, or have uncontrolled vomiting after trauma.  You had a serious burn. This includes burns to the face, neck, hands, or  genitals, or burns greater than the size of your palm anywhere else.  You develop neck stiffness or pain after a fall or from other trauma.  You develop a headache or vision problems after a fall or from other trauma.  You do not feel the baby moving or the baby is not moving as much as before a fall or other trauma.   This information is not intended to replace  advice given to you by your health care provider. Make sure you discuss any questions you have with your health care provider.   Document Released: 07/30/2004 Document Revised: 07/13/2014 Document Reviewed: 03/29/2013 Elsevier Interactive Patient Education 2016 [employer].  Second Trimester of Pregnancy The second trimester is from week 13 through week 28, months 4 through 6. The second trimester is often a time when you feel your best. Your body has also adjusted to being pregnant, and you begin to feel better physically. Usually, morning sickness has lessened or quit completely, you may have more energy, and you may have an increase in appetite. The second trimester is also a time when the fetus is growing rapidly. At the end of the sixth month, the fetus is about 9 inches long and weighs about 1 pounds. You will likely begin to feel the baby move (quickening) between 18 and 20 weeks of the pregnancy. BODY CHANGES Your body goes through many changes during pregnancy. The changes vary from woman to woman.   Your weight will continue to increase. You will notice your lower abdomen bulging out.  You may begin to get stretch marks on your hips, abdomen, and breasts.  You may develop headaches that can be relieved by medicines approved by your health care provider.  You may urinate more often because the fetus is pressing on your bladder.  You may develop or continue to have heartburn as a result of your pregnancy.  You may develop constipation because certain hormones are causing the muscles that push waste through your intestines to slow down.  You may develop hemorrhoids or swollen, bulging veins (varicose veins).  You may have back pain because of the weight gain and pregnancy hormones relaxing your joints between the bones in your pelvis and as a result of a shift in weight and the muscles that support your balance.  Your breasts will continue to grow and be tender.  Your gums may  bleed and may be sensitive to brushing and flossing.  Dark spots or blotches (chloasma, mask of pregnancy) may develop on your face. This will likely fade after the baby is born.  A dark line from your belly button to the pubic area (linea nigra) may appear. This will likely fade after the baby is born.  You may have changes in your hair. These can include thickening of your hair, rapid growth, and changes in texture. Some women also have hair loss during or after pregnancy, or hair that feels dry or thin. Your hair will most likely return to normal after your baby is born. WHAT TO EXPECT AT YOUR PRENATAL VISITS During a routine prenatal visit:  You will be weighed to make sure you and the fetus are growing normally.  Your blood pressure will be taken.  Your abdomen will be measured to track your baby's growth.  The fetal heartbeat will be listened to.  Any test results from the previous visit will be discussed. Your health care provider may ask you:  How you are feeling.  If you are feeling the  baby move.  If you have had any abnormal symptoms, such as leaking fluid, bleeding, severe headaches, or abdominal cramping.  If you are using any tobacco products, including cigarettes, chewing tobacco, and electronic cigarettes.  If you have any questions. Other tests that may be performed during your second trimester include:  Blood tests that check for:  Low iron levels (anemia).  Gestational diabetes (between 24 and 28 weeks).  Rh antibodies.  Urine tests to check for infections, diabetes, or protein in the urine.  An ultrasound to confirm the proper growth and development of the baby.  An amniocentesis to check for possible genetic problems.  Fetal screens for spina bifida and Down syndrome.  HIV (human immunodeficiency virus) testing. Routine prenatal testing includes screening for HIV, unless you choose not to have this test. HOME CARE INSTRUCTIONS   Avoid all  smoking, herbs, alcohol, and unprescribed drugs. These chemicals affect the formation and growth of the baby.  Do not use any tobacco products, including cigarettes, chewing tobacco, and electronic cigarettes. If you need help quitting, ask your health care provider. You may receive counseling support and other resources to help you quit.  Follow your health care provider's instructions regarding medicine use. There are medicines that are either safe or unsafe to take during pregnancy.  Exercise only as directed by your health care provider. Experiencing uterine cramps is a good sign to stop exercising.  Continue to eat regular, healthy meals.  Wear a good support bra for breast tenderness.  Do not use hot tubs, steam rooms, or saunas.  Wear your seat belt at all times when driving.  Avoid raw meat, uncooked cheese, cat litter boxes, and soil used by cats. These carry germs that can cause birth defects in the baby.  Take your prenatal vitamins.  Take 1500-2000 mg of calcium daily starting at the 20th week of pregnancy until you deliver your baby.  Try taking a stool softener (if your health care provider approves) if you develop constipation. Eat more high-fiber foods, such as fresh vegetables or fruit and whole grains. Drink plenty of fluids to keep your urine clear or pale yellow.  Take warm sitz baths to soothe any pain or discomfort caused by hemorrhoids. Use hemorrhoid cream if your health care provider approves.  If you develop varicose veins, wear support hose. Elevate your feet for 15 minutes, 3-4 times a day. Limit salt in your diet.  Avoid heavy lifting, wear low heel shoes, and practice good posture.  Rest with your legs elevated if you have leg cramps or low back pain.  Visit your dentist if you have not gone yet during your pregnancy. Use a soft toothbrush to brush your teeth and be gentle when you floss.  A sexual relationship may be continued unless your health care  provider directs you otherwise.  Continue to go to all your prenatal visits as directed by your health care provider. SEEK MEDICAL CARE IF:   You have dizziness.  You have mild pelvic cramps, pelvic pressure, or nagging pain in the abdominal area.  You have persistent nausea, vomiting, or diarrhea.  You have a bad smelling vaginal discharge.  You have pain with urination. SEEK IMMEDIATE MEDICAL CARE IF:   You have a fever.  You are leaking fluid from your vagina.  You have spotting or bleeding from your vagina.  You have severe abdominal cramping or pain.  You have rapid weight gain or loss.  You have shortness of breath with chest pain.  You notice sudden or extreme swelling of your face, hands, ankles, feet, or legs.  You have not felt your baby move in over an hour.  You have severe headaches that do not go away with medicine.  You have vision changes.   This information is not intended to replace advice given to you by your health care provider. Make sure you discuss any questions you have with your health care provider.   Document Released: 06/16/2001 Document Revised: 07/13/2014 Document Reviewed: 08/23/2012 Elsevier Interactive Patient Education [employer]

## 2015-08-28 NOTE — MAU Note (Signed)
FHMs removed and toco adjusted to monitor contractions per Wynelle Bourgeois CNM orders.

## 2015-09-12 DIAGNOSIS — S93402D Sprain of unspecified ligament of left ankle, subsequent encounter: Secondary | ICD-10-CM | POA: Diagnosis not present

## 2015-09-13 ENCOUNTER — Inpatient Hospital Stay (HOSPITAL_COMMUNITY): Payer: 59

## 2015-09-13 ENCOUNTER — Encounter (HOSPITAL_COMMUNITY): Payer: Self-pay | Admitting: *Deleted

## 2015-09-13 ENCOUNTER — Inpatient Hospital Stay (HOSPITAL_COMMUNITY)
Admission: AD | Admit: 2015-09-13 | Discharge: 2015-09-13 | Disposition: A | Discharge status: Home / Self Care | Payer: 59 | Source: Ambulatory Visit | Attending: Obstetrics and Gynecology | Admitting: Obstetrics and Gynecology

## 2015-09-13 DIAGNOSIS — Z87891 Personal history of nicotine dependence: Secondary | ICD-10-CM | POA: Insufficient documentation

## 2015-09-13 DIAGNOSIS — Z3A26 26 weeks gestation of pregnancy: Secondary | ICD-10-CM | POA: Insufficient documentation

## 2015-09-13 DIAGNOSIS — S3992XA Unspecified injury of lower back, initial encounter: Secondary | ICD-10-CM | POA: Diagnosis not present

## 2015-09-13 DIAGNOSIS — O9A213 Injury, poisoning and certain other consequences of external causes complicating pregnancy, third trimester: Secondary | ICD-10-CM

## 2015-09-13 DIAGNOSIS — J45909 Unspecified asthma, uncomplicated: Secondary | ICD-10-CM | POA: Insufficient documentation

## 2015-09-13 DIAGNOSIS — O30042 Twin pregnancy, dichorionic/diamniotic, second trimester: Secondary | ICD-10-CM | POA: Diagnosis not present

## 2015-09-13 DIAGNOSIS — O4703 False labor before 37 completed weeks of gestation, third trimester: Secondary | ICD-10-CM | POA: Diagnosis not present

## 2015-09-13 DIAGNOSIS — O26892 Other specified pregnancy related conditions, second trimester: Secondary | ICD-10-CM | POA: Diagnosis present

## 2015-09-13 DIAGNOSIS — O30002 Twin pregnancy, unspecified number of placenta and unspecified number of amniotic sacs, second trimester: Secondary | ICD-10-CM

## 2015-09-13 LAB — URINALYSIS, ROUTINE W REFLEX MICROSCOPIC
Bilirubin Urine: NEGATIVE
GLUCOSE, UA: NEGATIVE mg/dL
Hgb urine dipstick: NEGATIVE
Ketones, ur: 15 mg/dL — AB
Nitrite: NEGATIVE
PH: 6 (ref 5.0–8.0)
Protein, ur: NEGATIVE mg/dL
Specific Gravity, Urine: 1.015 (ref 1.005–1.030)

## 2015-09-13 LAB — URINE MICROSCOPIC-ADD ON: RBC / HPF: NONE SEEN RBC/hpf (ref 0–5)

## 2015-09-13 MED ORDER — CYCLOBENZAPRINE HCL 10 MG PO TABS
10.0000 mg | ORAL_TABLET | Freq: Once | ORAL | Status: AC
  Administered 2015-09-13: 10 mg via ORAL
  Filled 2015-09-13: qty 1

## 2015-09-13 NOTE — Discharge Instructions (Signed)
Braxton Hicks Contractions °Contractions of the uterus can occur throughout pregnancy. Contractions are not always a sign that you are in labor.  °WHAT ARE BRAXTON HICKS CONTRACTIONS?  °Contractions that occur before labor are called Braxton Hicks contractions, or false labor. Toward the end of pregnancy (32-34 weeks), these contractions can develop more often and may become more forceful. This is not true labor because these contractions do not result in opening (dilatation) and thinning of the cervix. They are sometimes difficult to tell apart from true labor because these contractions can be forceful and people have different pain tolerances. You should not feel embarrassed if you go to the hospital with false labor. Sometimes, the only way to tell if you are in true labor is for your health care provider to look for changes in the cervix. °If there are no prenatal problems or other health problems associated with the pregnancy, it is completely safe to be sent home with false labor and await the onset of true labor. °HOW CAN YOU TELL THE DIFFERENCE BETWEEN TRUE AND FALSE LABOR? °False Labor °· The contractions of false labor are usually shorter and not as hard as those of true labor.   °· The contractions are usually irregular.   °· The contractions are often felt in the front of the lower abdomen and in the groin.   °· The contractions may go away when you walk around or change positions while lying down.   °· The contractions get weaker and are shorter lasting as time goes on.   °· The contractions do not usually become progressively stronger, regular, and closer together as with true labor.   °True Labor °· Contractions in true labor last 30-70 seconds, become very regular, usually become more intense, and increase in frequency.   °· The contractions do not go away with walking.   °· The discomfort is usually felt in the top of the uterus and spreads to the lower abdomen and low back.   °· True labor can be  determined by your health care provider with an exam. This will show that the cervix is dilating and getting thinner.   °WHAT TO REMEMBER °· Keep up with your usual exercises and follow other instructions given by your health care provider.   °· Take medicines as directed by your health care provider.   °· Keep your regular prenatal appointments.   °· Eat and drink lightly if you think you are going into labor.   °· If Braxton Hicks contractions are making you uncomfortable:   °¨ Change your position from lying down or resting to walking, or from walking to resting.   °¨ Sit and rest in a tub of warm water.   °¨ Drink 2-3 glasses of water. Dehydration may cause these contractions.   °¨ Do slow and deep breathing several times an hour.   °WHEN SHOULD I SEEK IMMEDIATE MEDICAL CARE? °Seek immediate medical care if: °· Your contractions become stronger, more regular, and closer together.   °· You have fluid leaking or gushing from your vagina.   °· You have a fever.   °· You pass blood-tinged mucus.   °· You have vaginal bleeding.   °· You have continuous abdominal pain.   °· You have low back pain that you never had before.   °· You feel your baby's head pushing down and causing pelvic pressure.   °· Your baby is not moving as much as it used to.   °  °This information is not intended to replace advice given to you by your health care provider. Make sure you discuss any questions you have with your health care   provider.   Document Released: 06/22/2005 Document Revised: 06/27/2013 Document Reviewed: 04/03/2013 Elsevier Interactive Patient Education 2016 Else[employer] Inc[employer] What Do I Need to Know About Injuries During Pregnancy? Injuries can happen during pregnancy. Minor falls and accidents usually do not harm you or your baby. However, any injury should be reported to your doctor. WHAT CAN I DO TO PROTECT MYSELF FROM INJURIES?  Remove rugs and loose objects on the floor.  Wear comfortable shoes that have a good  grip. Do not wear high-heeled shoes.  Always wear your seat belt. The lap belt should be below your belly. Always practice safe driving.  Do not ride on a motorcycle.  Do not participate in high-impact activities or sports.  Avoid:  Walking on wet or slippery floors.  Fires.  Starting fires.  Lifting heavy pots of boiling or hot liquids.  Fixing electrical problems.  Only take medicine as told by your doctor.  Know your blood type and the blood type of the baby's father.  Call your local emergency services (911 in the [place] if you are a victim of domestic violence or assault. For help and support, contact the [employer] Domestic Violence Hotline. WHEN SHOULD I GET HELP RIGHT AWAY?  You fall on your belly or have any high-impact accident or injury.  You have been a victim of domestic violence or any kind of violence.  You have been in a car accident.  You have bleeding from your vagina.  Fluid is leaking from your vagina.  You start to have belly cramping (contractions) or pain.  You feel weak or pass out (faint).  You start to throw up (vomit) after an injury.  You have been burned.  You have a stiff neck or neck pain.  You get a headache or have vision problems after an injury.  You do not feel the baby move or the baby is not moving as much as normal.   This information is not intended to replace advice given to you by your health care provider. Make sure you discuss any questions you have with your health care provider.   Document Released: 07/25/2010 Document Revised: 07/13/2014 Document Reviewed: 03/29/2013 Elsevier Interactive Patient Education [employer]
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
## 2015-09-13 NOTE — MAU Note (Signed)
Urine sent to lab 

## 2015-09-18 DIAGNOSIS — Z3400 Encounter for supervision of normal first pregnancy, unspecified trimester: Secondary | ICD-10-CM | POA: Diagnosis not present

## 2015-09-18 DIAGNOSIS — Z23 Encounter for immunization: Secondary | ICD-10-CM | POA: Diagnosis not present

## 2015-09-18 DIAGNOSIS — Z36 Encounter for antenatal screening of mother: Secondary | ICD-10-CM | POA: Diagnosis not present

## 2015-09-18 DIAGNOSIS — O26892 Other specified pregnancy related conditions, second trimester: Secondary | ICD-10-CM | POA: Diagnosis not present

## 2015-09-18 DIAGNOSIS — Z3A27 27 weeks gestation of pregnancy: Secondary | ICD-10-CM | POA: Diagnosis not present

## 2015-09-25 DIAGNOSIS — H52221 Regular astigmatism, right eye: Secondary | ICD-10-CM | POA: Diagnosis not present

## 2015-09-25 DIAGNOSIS — H5213 Myopia, bilateral: Secondary | ICD-10-CM | POA: Diagnosis not present

## 2015-10-17 DIAGNOSIS — Z3400 Encounter for supervision of normal first pregnancy, unspecified trimester: Secondary | ICD-10-CM | POA: Diagnosis not present

## 2015-10-17 DIAGNOSIS — Z3A31 31 weeks gestation of pregnancy: Secondary | ICD-10-CM | POA: Diagnosis not present

## 2015-10-17 DIAGNOSIS — O30033 Twin pregnancy, monochorionic/diamniotic, third trimester: Secondary | ICD-10-CM | POA: Diagnosis not present

## 2015-10-22 ENCOUNTER — Ambulatory Visit (INDEPENDENT_AMBULATORY_CARE_PROVIDER_SITE_OTHER): Payer: 59 | Admitting: General Practice

## 2015-10-22 VITALS — BP 118/70 | HR 82

## 2015-10-22 DIAGNOSIS — O30043 Twin pregnancy, dichorionic/diamniotic, third trimester: Secondary | ICD-10-CM | POA: Diagnosis not present

## 2015-10-22 DIAGNOSIS — O403XX2 Polyhydramnios, third trimester, fetus 2: Secondary | ICD-10-CM

## 2015-10-22 DIAGNOSIS — O403XX0 Polyhydramnios, third trimester, not applicable or unspecified: Secondary | ICD-10-CM

## 2015-10-22 NOTE — Progress Notes (Signed)
Patient has appt today with Dr Henderson Cloudomblin. Copy of report and tracing sent with patient

## 2015-10-29 ENCOUNTER — Ambulatory Visit (INDEPENDENT_AMBULATORY_CARE_PROVIDER_SITE_OTHER): Payer: 59 | Admitting: *Deleted

## 2015-10-29 VITALS — BP 115/65 | HR 69

## 2015-10-29 DIAGNOSIS — IMO0001 Reserved for inherently not codable concepts without codable children: Secondary | ICD-10-CM

## 2015-10-29 DIAGNOSIS — O30043 Twin pregnancy, dichorionic/diamniotic, third trimester: Secondary | ICD-10-CM

## 2015-10-29 NOTE — Progress Notes (Signed)
Here for NST Twins.Seeing Dr. Marcelle OverlieHolland this afternoon. Copy of NST tracing and report sent with patient.

## 2015-11-06 ENCOUNTER — Ambulatory Visit (INDEPENDENT_AMBULATORY_CARE_PROVIDER_SITE_OTHER): Payer: 59 | Admitting: General Practice

## 2015-11-06 VITALS — BP 100/65 | HR 79

## 2015-11-06 DIAGNOSIS — O30043 Twin pregnancy, dichorionic/diamniotic, third trimester: Secondary | ICD-10-CM | POA: Diagnosis not present

## 2015-11-06 DIAGNOSIS — O30003 Twin pregnancy, unspecified number of placenta and unspecified number of amniotic sacs, third trimester: Secondary | ICD-10-CM

## 2015-11-06 DIAGNOSIS — Z3A34 34 weeks gestation of pregnancy: Secondary | ICD-10-CM | POA: Diagnosis not present

## 2015-11-06 NOTE — Progress Notes (Signed)
Patient seeing Dr Renaldo FiddlerAdkins today at 11am. Copy of report & tracing sent with patient.

## 2015-11-10 ENCOUNTER — Inpatient Hospital Stay (HOSPITAL_COMMUNITY): Payer: 59 | Admitting: Anesthesiology

## 2015-11-10 ENCOUNTER — Inpatient Hospital Stay (HOSPITAL_COMMUNITY): Payer: 59

## 2015-11-10 ENCOUNTER — Encounter (HOSPITAL_COMMUNITY): Payer: Self-pay | Admitting: Certified Nurse Midwife

## 2015-11-10 ENCOUNTER — Inpatient Hospital Stay (HOSPITAL_COMMUNITY)
Admission: AD | Admit: 2015-11-10 | Discharge: 2015-11-14 | DRG: 765 | Disposition: A | Discharge status: Home / Self Care | Payer: 59 | Source: Ambulatory Visit | Attending: Obstetrics and Gynecology | Admitting: Obstetrics and Gynecology

## 2015-11-10 ENCOUNTER — Encounter (HOSPITAL_COMMUNITY)
Admission: AD | Disposition: A | Discharge status: Home / Self Care | Payer: Self-pay | Source: Ambulatory Visit | Attending: Obstetrics and Gynecology

## 2015-11-10 DIAGNOSIS — Z833 Family history of diabetes mellitus: Secondary | ICD-10-CM | POA: Diagnosis not present

## 2015-11-10 DIAGNOSIS — O4693 Antepartum hemorrhage, unspecified, third trimester: Secondary | ICD-10-CM | POA: Diagnosis not present

## 2015-11-10 DIAGNOSIS — Z98891 History of uterine scar from previous surgery: Secondary | ICD-10-CM

## 2015-11-10 DIAGNOSIS — Z3A35 35 weeks gestation of pregnancy: Secondary | ICD-10-CM

## 2015-11-10 DIAGNOSIS — O4700 False labor before 37 completed weeks of gestation, unspecified trimester: Secondary | ICD-10-CM | POA: Diagnosis present

## 2015-11-10 DIAGNOSIS — Z87891 Personal history of nicotine dependence: Secondary | ICD-10-CM

## 2015-11-10 DIAGNOSIS — O4703 False labor before 37 completed weeks of gestation, third trimester: Secondary | ICD-10-CM | POA: Diagnosis not present

## 2015-11-10 DIAGNOSIS — Z825 Family history of asthma and other chronic lower respiratory diseases: Secondary | ICD-10-CM

## 2015-11-10 DIAGNOSIS — J45909 Unspecified asthma, uncomplicated: Secondary | ICD-10-CM | POA: Diagnosis present

## 2015-11-10 DIAGNOSIS — O30043 Twin pregnancy, dichorionic/diamniotic, third trimester: Secondary | ICD-10-CM | POA: Diagnosis not present

## 2015-11-10 DIAGNOSIS — O9952 Diseases of the respiratory system complicating childbirth: Secondary | ICD-10-CM | POA: Diagnosis present

## 2015-11-10 DIAGNOSIS — O479 False labor, unspecified: Secondary | ICD-10-CM | POA: Diagnosis present

## 2015-11-10 DIAGNOSIS — O30003 Twin pregnancy, unspecified number of placenta and unspecified number of amniotic sacs, third trimester: Secondary | ICD-10-CM | POA: Diagnosis not present

## 2015-11-10 LAB — CBC
HCT: 38.2 % (ref 36.0–46.0)
Hemoglobin: 13.1 g/dL (ref 12.0–15.0)
MCH: 29.2 pg (ref 26.0–34.0)
MCHC: 34.3 g/dL (ref 30.0–36.0)
MCV: 85.1 fL (ref 78.0–100.0)
PLATELETS: 163 10*3/uL (ref 150–400)
RBC: 4.49 MIL/uL (ref 3.87–5.11)
RDW: 13.6 % (ref 11.5–15.5)
WBC: 11.2 10*3/uL — ABNORMAL HIGH (ref 4.0–10.5)

## 2015-11-10 LAB — URINALYSIS, ROUTINE W REFLEX MICROSCOPIC
BILIRUBIN URINE: NEGATIVE
Glucose, UA: NEGATIVE mg/dL
KETONES UR: NEGATIVE mg/dL
NITRITE: NEGATIVE
PROTEIN: NEGATIVE mg/dL
Specific Gravity, Urine: 1.01 (ref 1.005–1.030)
pH: 6.5 (ref 5.0–8.0)

## 2015-11-10 LAB — ABO/RH: ABO/RH(D): O POS

## 2015-11-10 LAB — TYPE AND SCREEN
ABO/RH(D): O POS
ANTIBODY SCREEN: NEGATIVE

## 2015-11-10 LAB — URINE MICROSCOPIC-ADD ON

## 2015-11-10 SURGERY — Surgical Case
Anesthesia: Spinal

## 2015-11-10 MED ORDER — NALOXONE HCL 2 MG/2ML IJ SOSY
1.0000 ug/kg/h | PREFILLED_SYRINGE | INTRAVENOUS | Status: DC | PRN
  Filled 2015-11-10: qty 2

## 2015-11-10 MED ORDER — SCOPOLAMINE 1 MG/3DAYS TD PT72
MEDICATED_PATCH | TRANSDERMAL | Status: AC
  Filled 2015-11-10: qty 1

## 2015-11-10 MED ORDER — SCOPOLAMINE 1 MG/3DAYS TD PT72
1.0000 | MEDICATED_PATCH | Freq: Once | TRANSDERMAL | Status: DC
  Filled 2015-11-10: qty 1

## 2015-11-10 MED ORDER — SCOPOLAMINE 1 MG/3DAYS TD PT72
MEDICATED_PATCH | TRANSDERMAL | Status: DC | PRN
  Administered 2015-11-10: 1 via TRANSDERMAL

## 2015-11-10 MED ORDER — MORPHINE SULFATE (PF) 0.5 MG/ML IJ SOLN
INTRAMUSCULAR | Status: AC
  Filled 2015-11-10: qty 10

## 2015-11-10 MED ORDER — SIMETHICONE 80 MG PO CHEW
80.0000 mg | CHEWABLE_TABLET | ORAL | Status: DC
  Administered 2015-11-11 – 2015-11-14 (×2): 80 mg via ORAL
  Filled 2015-11-10 (×3): qty 1

## 2015-11-10 MED ORDER — DIBUCAINE 1 % RE OINT: 1.0000 "application " | TOPICAL_OINTMENT | RECTAL | Status: DC | PRN

## 2015-11-10 MED ORDER — ONDANSETRON HCL 4 MG/2ML IJ SOLN
INTRAMUSCULAR | Status: AC
  Filled 2015-11-10: qty 2

## 2015-11-10 MED ORDER — MENTHOL 3 MG MT LOZG: 1.0000 | LOZENGE | OROMUCOSAL | Status: DC | PRN

## 2015-11-10 MED ORDER — ACETAMINOPHEN 325 MG PO TABS: 650.0000 mg | ORAL_TABLET | ORAL | Status: DC | PRN

## 2015-11-10 MED ORDER — GENTAMICIN SULFATE 40 MG/ML IJ SOLN
Freq: Once | INTRAVENOUS | Status: AC
  Administered 2015-11-10: 114.5 mL via INTRAVENOUS
  Filled 2015-11-10: qty 8.75

## 2015-11-10 MED ORDER — ZOLPIDEM TARTRATE 5 MG PO TABS: 5.0000 mg | ORAL_TABLET | Freq: Every evening | ORAL | Status: DC | PRN

## 2015-11-10 MED ORDER — BUTORPHANOL TARTRATE 1 MG/ML IJ SOLN
INTRAMUSCULAR | Status: AC
  Filled 2015-11-10: qty 2

## 2015-11-10 MED ORDER — MEPERIDINE HCL 25 MG/ML IJ SOLN
6.2500 mg | INTRAMUSCULAR | Status: AC | PRN
  Administered 2015-11-10 (×2): 6.25 mg via INTRAVENOUS

## 2015-11-10 MED ORDER — FENTANYL CITRATE (PF) 100 MCG/2ML IJ SOLN
INTRAMUSCULAR | Status: DC | PRN
  Administered 2015-11-10: 10 ug via INTRATHECAL

## 2015-11-10 MED ORDER — ONDANSETRON HCL 4 MG/2ML IJ SOLN
4.0000 mg | Freq: Four times a day (QID) | INTRAMUSCULAR | Status: DC | PRN
  Administered 2015-11-10: 4 mg via INTRAVENOUS

## 2015-11-10 MED ORDER — SENNOSIDES-DOCUSATE SODIUM 8.6-50 MG PO TABS
2.0000 | ORAL_TABLET | ORAL | Status: DC
  Administered 2015-11-11 – 2015-11-14 (×3): 2 via ORAL
  Filled 2015-11-10 (×3): qty 2

## 2015-11-10 MED ORDER — ACETAMINOPHEN 500 MG PO TABS
1000.0000 mg | ORAL_TABLET | Freq: Four times a day (QID) | ORAL | Status: AC
  Administered 2015-11-11 (×3): 1000 mg via ORAL
  Filled 2015-11-10 (×3): qty 2

## 2015-11-10 MED ORDER — PHENYLEPHRINE 8 MG IN D5W 100 ML (0.08MG/ML) PREMIX OPTIME
INJECTION | INTRAVENOUS | Status: DC | PRN
  Administered 2015-11-10: 60 ug/min via INTRAVENOUS

## 2015-11-10 MED ORDER — SIMETHICONE 80 MG PO CHEW
80.0000 mg | CHEWABLE_TABLET | Freq: Three times a day (TID) | ORAL | Status: DC
  Administered 2015-11-11 – 2015-11-14 (×11): 80 mg via ORAL
  Filled 2015-11-10 (×11): qty 1

## 2015-11-10 MED ORDER — OXYTOCIN BOLUS FROM INFUSION: 500.0000 mL | INTRAVENOUS | Status: DC

## 2015-11-10 MED ORDER — LACTATED RINGERS IV SOLN: 500.0000 mL | INTRAVENOUS | Status: DC | PRN

## 2015-11-10 MED ORDER — TETANUS-DIPHTH-ACELL PERTUSSIS 5-2.5-18.5 LF-MCG/0.5 IM SUSP: 0.5000 mL | Freq: Once | INTRAMUSCULAR | Status: DC

## 2015-11-10 MED ORDER — SODIUM CHLORIDE 0.9 % IR SOLN
Status: DC | PRN
  Administered 2015-11-10: 1000 mL

## 2015-11-10 MED ORDER — BUPIVACAINE IN DEXTROSE 0.75-8.25 % IT SOLN
INTRATHECAL | Status: DC | PRN
  Administered 2015-11-10: 1.7 mL via INTRATHECAL

## 2015-11-10 MED ORDER — LACTATED RINGERS IV SOLN
INTRAVENOUS | Status: DC
  Administered 2015-11-10 (×3): via INTRAVENOUS

## 2015-11-10 MED ORDER — FLEET ENEMA 7-19 GM/118ML RE ENEM
1.0000 | ENEMA | RECTAL | Status: DC | PRN
Start: 2015-11-10 — End: 2015-11-10

## 2015-11-10 MED ORDER — COCONUT OIL OIL: 1.0000 "application " | TOPICAL_OIL | Status: DC | PRN

## 2015-11-10 MED ORDER — OXYCODONE-ACETAMINOPHEN 5-325 MG PO TABS: 2.0000 | ORAL_TABLET | ORAL | Status: DC | PRN

## 2015-11-10 MED ORDER — OXYTOCIN 10 UNIT/ML IJ SOLN
INTRAMUSCULAR | Status: AC
  Filled 2015-11-10: qty 4

## 2015-11-10 MED ORDER — NALBUPHINE HCL 10 MG/ML IJ SOLN: 5.0000 mg | INTRAMUSCULAR | Status: DC | PRN

## 2015-11-10 MED ORDER — LIDOCAINE HCL (PF) 1 % IJ SOLN: 30.0000 mL | INTRAMUSCULAR | Status: DC | PRN

## 2015-11-10 MED ORDER — CITRIC ACID-SODIUM CITRATE 334-500 MG/5ML PO SOLN
30.0000 mL | ORAL | Status: DC | PRN
Start: 2015-11-10 — End: 2015-11-10
  Administered 2015-11-10: 30 mL via ORAL
  Filled 2015-11-10: qty 15

## 2015-11-10 MED ORDER — OXYCODONE-ACETAMINOPHEN 5-325 MG PO TABS: 1.0000 | ORAL_TABLET | ORAL | Status: DC | PRN

## 2015-11-10 MED ORDER — KETOROLAC TROMETHAMINE 30 MG/ML IJ SOLN
INTRAMUSCULAR | Status: AC
  Filled 2015-11-10: qty 1

## 2015-11-10 MED ORDER — DIPHENHYDRAMINE HCL 25 MG PO CAPS: 25.0000 mg | ORAL_CAPSULE | Freq: Four times a day (QID) | ORAL | Status: DC | PRN

## 2015-11-10 MED ORDER — NALBUPHINE HCL 10 MG/ML IJ SOLN
5.0000 mg | INTRAMUSCULAR | Status: DC | PRN
  Administered 2015-11-11: 5 mg via SUBCUTANEOUS
  Filled 2015-11-10: qty 1

## 2015-11-10 MED ORDER — NALOXONE HCL 0.4 MG/ML IJ SOLN: 0.4000 mg | INTRAMUSCULAR | Status: DC | PRN

## 2015-11-10 MED ORDER — PRENATAL MULTIVITAMIN CH
1.0000 | ORAL_TABLET | Freq: Every day | ORAL | Status: DC
  Administered 2015-11-11 – 2015-11-14 (×4): 1 via ORAL
  Filled 2015-11-10 (×3): qty 1

## 2015-11-10 MED ORDER — FENTANYL CITRATE (PF) 100 MCG/2ML IJ SOLN
25.0000 ug | INTRAMUSCULAR | Status: DC | PRN
  Administered 2015-11-10: 50 ug via INTRAVENOUS

## 2015-11-10 MED ORDER — SODIUM CHLORIDE 0.9% FLUSH: 3.0000 mL | INTRAVENOUS | Status: DC | PRN

## 2015-11-10 MED ORDER — ONDANSETRON HCL 4 MG/2ML IJ SOLN: 4.0000 mg | Freq: Three times a day (TID) | INTRAMUSCULAR | Status: DC | PRN

## 2015-11-10 MED ORDER — ACETAMINOPHEN 325 MG PO TABS
650.0000 mg | ORAL_TABLET | ORAL | Status: DC | PRN
  Administered 2015-11-13 – 2015-11-14 (×4): 650 mg via ORAL
  Filled 2015-11-10 (×4): qty 2

## 2015-11-10 MED ORDER — OXYTOCIN 10 UNIT/ML IJ SOLN: 2.5000 [IU]/h | INTRAVENOUS | Status: AC

## 2015-11-10 MED ORDER — FENTANYL CITRATE (PF) 100 MCG/2ML IJ SOLN
INTRAMUSCULAR | Status: AC
  Filled 2015-11-10: qty 2

## 2015-11-10 MED ORDER — WITCH HAZEL-GLYCERIN EX PADS: 1.0000 "application " | MEDICATED_PAD | CUTANEOUS | Status: DC | PRN

## 2015-11-10 MED ORDER — BUTORPHANOL TARTRATE 1 MG/ML IJ SOLN
2.0000 mg | Freq: Once | INTRAMUSCULAR | Status: AC
  Administered 2015-11-10: 2 mg via INTRAVENOUS

## 2015-11-10 MED ORDER — OXYTOCIN 10 UNIT/ML IJ SOLN: 2.5000 [IU]/h | INTRAVENOUS | Status: DC

## 2015-11-10 MED ORDER — KETOROLAC TROMETHAMINE 30 MG/ML IJ SOLN
30.0000 mg | Freq: Four times a day (QID) | INTRAMUSCULAR | Status: AC | PRN
  Administered 2015-11-10: 30 mg via INTRAMUSCULAR

## 2015-11-10 MED ORDER — DIPHENHYDRAMINE HCL 25 MG PO CAPS: 25.0000 mg | ORAL_CAPSULE | ORAL | Status: DC | PRN

## 2015-11-10 MED ORDER — MEPERIDINE HCL 25 MG/ML IJ SOLN
INTRAMUSCULAR | Status: AC
  Filled 2015-11-10: qty 1

## 2015-11-10 MED ORDER — NALBUPHINE HCL 10 MG/ML IJ SOLN: 5.0000 mg | Freq: Once | INTRAMUSCULAR | Status: DC | PRN

## 2015-11-10 MED ORDER — KETOROLAC TROMETHAMINE 30 MG/ML IJ SOLN: 30.0000 mg | Freq: Four times a day (QID) | INTRAMUSCULAR | Status: AC | PRN

## 2015-11-10 MED ORDER — LACTATED RINGERS IV SOLN
INTRAVENOUS | Status: DC
Start: 2015-11-11 — End: 2015-11-14
  Administered 2015-11-11: 125 mL/h via INTRAVENOUS

## 2015-11-10 MED ORDER — ONDANSETRON HCL 4 MG/2ML IJ SOLN: 4.0000 mg | Freq: Once | INTRAMUSCULAR | Status: DC | PRN

## 2015-11-10 MED ORDER — IBUPROFEN 600 MG PO TABS
600.0000 mg | ORAL_TABLET | Freq: Four times a day (QID) | ORAL | Status: DC
  Administered 2015-11-11 – 2015-11-14 (×14): 600 mg via ORAL
  Filled 2015-11-10 (×13): qty 1

## 2015-11-10 MED ORDER — SIMETHICONE 80 MG PO CHEW
80.0000 mg | CHEWABLE_TABLET | ORAL | Status: DC | PRN
  Administered 2015-11-11: 80 mg via ORAL

## 2015-11-10 MED ORDER — DIPHENHYDRAMINE HCL 50 MG/ML IJ SOLN
12.5000 mg | INTRAMUSCULAR | Status: DC | PRN
Start: 2015-11-10 — End: 2015-11-14

## 2015-11-10 MED ORDER — OXYTOCIN 10 UNIT/ML IJ SOLN
40.0000 [IU] | INTRAVENOUS | Status: DC | PRN
  Administered 2015-11-10: 40 [IU] via INTRAVENOUS

## 2015-11-10 MED ORDER — MORPHINE SULFATE (PF) 0.5 MG/ML IJ SOLN
INTRAMUSCULAR | Status: DC | PRN
Start: 2015-11-10 — End: 2015-11-10
  Administered 2015-11-10: .2 mg via INTRATHECAL

## 2015-11-10 MED ORDER — LACTATED RINGERS IV SOLN
INTRAVENOUS | Status: DC | PRN
  Administered 2015-11-10: 20:00:00 via INTRAVENOUS

## 2015-11-10 MED ORDER — LIDOCAINE HCL (PF) 1 % IJ SOLN
INTRAMUSCULAR | Status: AC
  Filled 2015-11-10: qty 5

## 2015-11-10 SURGICAL SUPPLY — 38 items
APL SKNCLS STERI-STRIP NONHPOA (GAUZE/BANDAGES/DRESSINGS) ×1
BARRIER ADHS 3X4 INTERCEED (GAUZE/BANDAGES/DRESSINGS) IMPLANT
BENZOIN TINCTURE PRP APPL 2/3 (GAUZE/BANDAGES/DRESSINGS) ×2 IMPLANT
BRR ADH 4X3 ABS CNTRL BYND (GAUZE/BANDAGES/DRESSINGS)
CLAMP CORD UMBIL (MISCELLANEOUS) IMPLANT
CLOSURE STERI STRIP 1/2 X4 (GAUZE/BANDAGES/DRESSINGS) ×2 IMPLANT
CLOTH BEACON ORANGE TIMEOUT ST (SAFETY) ×3 IMPLANT
CONTAINER PREFILL 10% NBF 15ML (MISCELLANEOUS) IMPLANT
DRSG OPSITE POSTOP 4X10 (GAUZE/BANDAGES/DRESSINGS) ×3 IMPLANT
DURAPREP 26ML APPLICATOR (WOUND CARE) ×3 IMPLANT
ELECT REM PT RETURN 9FT ADLT (ELECTROSURGICAL) ×3
ELECTRODE REM PT RTRN 9FT ADLT (ELECTROSURGICAL) ×1 IMPLANT
EXTRACTOR VACUUM M CUP 4 TUBE (SUCTIONS) IMPLANT
EXTRACTOR VACUUM M CUP 4' TUBE (SUCTIONS)
GLOVE BIO SURGEON STRL SZ 6.5 (GLOVE) ×2 IMPLANT
GLOVE BIO SURGEONS STRL SZ 6.5 (GLOVE) ×1
GLOVE BIOGEL PI IND STRL 7.0 (GLOVE) ×1 IMPLANT
GLOVE BIOGEL PI INDICATOR 7.0 (GLOVE) ×2
GOWN STRL REUS W/TWL LRG LVL3 (GOWN DISPOSABLE) ×6 IMPLANT
KIT ABG SYR 3ML LUER SLIP (SYRINGE) IMPLANT
NDL HYPO 25X5/8 SAFETYGLIDE (NEEDLE) ×1 IMPLANT
NEEDLE HYPO 22GX1.5 SAFETY (NEEDLE) IMPLANT
NEEDLE HYPO 25X5/8 SAFETYGLIDE (NEEDLE) ×3 IMPLANT
NS IRRIG 1000ML POUR BTL (IV SOLUTION) ×3 IMPLANT
PACK C SECTION WH (CUSTOM PROCEDURE TRAY) ×3 IMPLANT
PAD OB MATERNITY 4.3X12.25 (PERSONAL CARE ITEMS) ×3 IMPLANT
PENCIL SMOKE EVAC W/HOLSTER (ELECTROSURGICAL) ×3 IMPLANT
SUT CHROMIC 0 CTX 36 (SUTURE) ×6 IMPLANT
SUT PLAIN 0 NONE (SUTURE) IMPLANT
SUT PLAIN 2 0 XLH (SUTURE) IMPLANT
SUT VIC AB 0 CT1 27 (SUTURE) ×9
SUT VIC AB 0 CT1 27XBRD ANBCTR (SUTURE) ×3 IMPLANT
SUT VIC AB 4-0 KS 27 (SUTURE) IMPLANT
SYR BULB IRRIGATION 50ML (SYRINGE) ×2 IMPLANT
SYR CONTROL 10ML LL (SYRINGE) IMPLANT
SYRINGE 10CC LL (SYRINGE) ×2 IMPLANT
TOWEL OR 17X24 6PK STRL BLUE (TOWEL DISPOSABLE) ×3 IMPLANT
TRAY FOLEY CATH SILVER 14FR (SET/KITS/TRAYS/PACK) ×3 IMPLANT

## 2015-11-10 NOTE — Anesthesia Postprocedure Evaluation (Signed)
Anesthesia Post Note  Patient: Angel [name]  Procedure(s) Performed: Procedure(s) (LRB): CESAREAN SECTION (N/A)  Patient location during evaluation: PACU Anesthesia Type: Spinal Level of consciousness: oriented and awake and alert Pain management: pain level controlled Vital Signs Assessment: post-procedure vital signs reviewed and stable Respiratory status: spontaneous breathing, respiratory function stable and patient connected to nasal cannula oxygen Cardiovascular status: blood pressure returned to baseline and stable Postop Assessment: no headache, no backache and spinal receding Anesthetic complications: no     Last Vitals:  Filed Vitals:   11/10/15 2035 11/10/15 2048  BP: 120/95 105/61  Pulse: 88 76  Temp: 36.3 C   Resp: 10 20    Last Pain:  Filed Vitals:   11/10/15 2055  PainSc: 0-No pain   Pain Goal:                 Reino KentJudd, Anola Mcgough J

## 2015-11-10 NOTE — Anesthesia Preprocedure Evaluation (Signed)
Anesthesia Evaluation  Patient identified by MRN, date of birth, ID band Patient awake    Reviewed: Allergy & Precautions, H&P , NPO status , Patient's Chart, lab work & pertinent test results, reviewed documented beta blocker date and time   Airway Mallampati: III  TM Distance: >3 FB Neck ROM: full    Dental no notable dental hx.    Pulmonary asthma , former smoker,    Pulmonary exam normal breath sounds clear to auscultation       Cardiovascular negative cardio ROS Normal cardiovascular exam Rhythm:regular Rate:Normal     Neuro/Psych negative neurological ROS  negative psych ROS   GI/Hepatic negative GI ROS, Neg liver ROS,   Endo/Other  negative endocrine ROS  Renal/GU negative Renal ROS  negative genitourinary   Musculoskeletal   Abdominal   Peds  Hematology negative hematology ROS (+)   Anesthesia Other Findings Pregnancy - twin pregnancy needing C section Platelets and allergies reviewed Denies active cardiac or pulmonary symptoms, METS > 4  Denies blood thinning medications, bleeding disorders, hypertension, supine hypotension syndrome, previous anesthesia difficulties    Reproductive/Obstetrics (+) Pregnancy                             Anesthesia Physical Anesthesia Plan  ASA: III  Anesthesia Plan: Spinal   Post-op Pain Management:    Induction:   Airway Management Planned:   Additional Equipment:   Intra-op Plan:   Post-operative Plan:   Informed Consent: I have reviewed the patients History and Physical, chart, labs and discussed the procedure including the risks, benefits and alternatives for the proposed anesthesia with the patient or authorized representative who has indicated his/her understanding and acceptance.     Plan Discussed with:   Anesthesia Plan Comments:         Anesthesia Quick Evaluation

## 2015-11-10 NOTE — Transfer of Care (Signed)
Immediate Anesthesia Transfer of Care Note  Patient: Angel [name]  Procedure(s) Performed: Procedure(s): CESAREAN SECTION (N/A)  Patient Location: PACU  Anesthesia Type:Spinal  Level of Consciousness: awake, alert  and oriented  Airway & Oxygen Therapy: Patient Spontanous Breathing  Post-op Assessment: Report given to RN  Post vital signs: Reviewed  Last Vitals:  Filed Vitals:   11/10/15 1642 11/10/15 1722  BP: 140/83 121/58  Pulse: 91 80  Temp:    Resp:      Last Pain:  Filed Vitals:   11/10/15 1728  PainSc: 6          Complications: No apparent anesthesia complications

## 2015-11-10 NOTE — Anesthesia Procedure Notes (Signed)
Spinal Patient location during procedure: OB Staffing Anesthesiologist: Sanjeev Main Performed by: anesthesiologist  Preanesthetic Checklist Completed: patient identified, site marked, surgical consent, pre-op evaluation, timeout performed, IV checked, risks and benefits discussed and monitors and equipment checked Spinal Block Patient position: sitting Prep: DuraPrep Patient monitoring: heart rate, continuous pulse ox and blood pressure Approach: midline Location: L4-5 Injection technique: single-shot Needle Needle type: Spinocan  Needle gauge: 25 G Assessment Sensory level: T4 Events: paresthesia and single parasthesia elicited so needle quickly withdrawn and intrathecal space approached successfully from different level, parasthesia gone immediately Additional Notes Functioning IV was confirmed and monitors were applied. Sterile prep and drape, including hand hygiene, mask and sterile gloves were used. The patient was positioned and the spine was prepped. The skin was anesthetized with lidocaine.  Free flow of clear CSF was obtained prior to injecting local anesthetic into the CSF.  The spinal needle aspirated freely following injection.  The needle was carefully withdrawn.  The patient tolerated the procedure well. Consent was obtained prior to procedure with all questions answered and concerns addressed.  Blima DessertBen Gale Klar, MD

## 2015-11-10 NOTE — MAU Note (Signed)
Pt states she has had vaginal bleeding like a light period since 3 AM this AM> Pt states she feels irregular ctxs. Pt states +FM for both babies. Pt denies LOF.

## 2015-11-10 NOTE — Brief Op Note (Signed)
11/10/2015  8:18 PM  PATIENT:  Angel [name]  25 y.o. female  PRE-OPERATIVE DIAGNOSIS:   IUP at 2335 w 1 day Twins Labor Vertex / Vertex Declines labor  POST-OPERATIVE DIAGNOSIS:  Same  PROCEDURE:  Procedure(s): CESAREAN SECTION (N/A)  SURGEON:  Surgeon(s) and Role:    * Marcelle OverlieMichelle Alia Parsley, MD - Primary  PHYSICIAN ASSISTANT:   ASSISTANTS: none   ANESTHESIA:   spinal  EBL:  Total I/O In: 2900 [I.V.:2900] Out: 500 [Blood:500]  BLOOD ADMINISTERED:none  DRAINS: Urinary Catheter (Foley)   LOCAL MEDICATIONS USED:  NONE  SPECIMEN:  Source of Specimen:  placenta  DISPOSITION OF SPECIMEN:  Source of Specimen:  placentas  COUNTS:  YES  TOURNIQUET:  * No tourniquets in log *  DICTATION: .Other Dictation: Dictation Number [id number]  PLAN OF CARE: Admit to inpatient   PATIENT DISPOSITION:  PACU - hemodynamically stable.   Delay start of Pharmacological VTE agent (>24hrs) due to surgical blood loss or risk of bleeding: not applicable

## 2015-11-10 NOTE — H&P (Signed)
Angel [name] is a 25 y.o. G 1 P 0 at 35 w 1 day with Vertex/Vertex Twins presented to MAU with contractions. She originally was 3 cm dilated in the hospital and now is 4 to 5 cm.  She now is requesting a C Section. Despite counseling that she has option of labor versus C Section she and her spouse have elected to have a C Section. Maternal Medical History:  Reason for admission: Contractions.     OB History    Gravida Para Term Preterm AB TAB SAB Ectopic Multiple Living   1              Past Medical History  Diagnosis Date  . Constipation   . Asthma    Past Surgical History  Procedure Laterality Date  . Arm surgery     Family History: family history includes Arthritis in her mother; Asthma in her mother; Diabetes in her father. Social History:  reports that she has quit smoking. Her smoking use included Cigarettes. She smoked 1.00 pack per day. She has never used smokeless tobacco. She reports that she does not drink alcohol or use illicit drugs.   Prenatal Transfer Tool  Maternal Diabetes: No Genetic Screening: Normal Maternal Ultrasounds/Referrals: Normal Fetal Ultrasounds or other Referrals:  None Maternal Substance Abuse:  No Significant Maternal Medications:  None Significant Maternal Lab Results:  None Other Comments:  None  Review of Systems  All other systems reviewed and are negative.   Dilation: 4.5 Effacement (%): 90 Station: -2 Exam by:: dr Vincente Poligrewal Blood pressure 121/58, pulse 80, temperature 98.2 F (36.8 C), temperature source Oral, resp. rate 18, height 5\' 5"  (1.651 m), weight 91.627 kg (202 lb), last menstrual period 03/09/2015. Maternal Exam:  Abdomen: Fetal presentation: vertex     Fetal Exam Fetal State Assessment: Category I - tracings are normal.     Physical Exam  Nursing note and vitals reviewed. Constitutional: She appears well-developed.  HENT:  Head: Normocephalic.  Eyes: Pupils are equal, round, and reactive to light.  Neck:  Normal range of motion.  Cardiovascular: Normal rate and regular rhythm.   Respiratory: Effort normal.    Prenatal labs: ABO, Rh: --/--/O POS (05/07 1450) Antibody: NEG (05/07 1450) Rubella: Immune (11/09 0000) RPR: Nonreactive (11/09 0000)  HBsAg: Negative (11/09 0000)  HIV: Non-reactive (11/09 0000)  GBS:     Assessment/Plan: IUP at 35 w 1 day  Twins Labor Vertex / Vertex Patient desires C Section - risks reviewed Consent signed   Tanveer Dobberstein L 11/10/2015, 5:35 PM

## 2015-11-11 ENCOUNTER — Encounter (HOSPITAL_COMMUNITY): Payer: Self-pay | Admitting: Obstetrics and Gynecology

## 2015-11-11 LAB — RPR: RPR Ser Ql: NONREACTIVE

## 2015-11-11 LAB — CBC
HEMATOCRIT: 35.2 % — AB (ref 36.0–46.0)
Hemoglobin: 12 g/dL (ref 12.0–15.0)
MCH: 29.1 pg (ref 26.0–34.0)
MCHC: 34.1 g/dL (ref 30.0–36.0)
MCV: 85.2 fL (ref 78.0–100.0)
Platelets: 138 10*3/uL — ABNORMAL LOW (ref 150–400)
RBC: 4.13 MIL/uL (ref 3.87–5.11)
RDW: 13.7 % (ref 11.5–15.5)
WBC: 15.4 10*3/uL — ABNORMAL HIGH (ref 4.0–10.5)

## 2015-11-11 MED ORDER — OXYCODONE HCL 5 MG PO TABS
5.0000 mg | ORAL_TABLET | Freq: Four times a day (QID) | ORAL | Status: DC | PRN
  Administered 2015-11-11: 5 mg via ORAL
  Filled 2015-11-11: qty 1

## 2015-11-11 MED ORDER — OXYCODONE HCL 5 MG PO TABS
10.0000 mg | ORAL_TABLET | Freq: Four times a day (QID) | ORAL | Status: DC | PRN
  Administered 2015-11-11 – 2015-11-13 (×7): 10 mg via ORAL
  Filled 2015-11-11 (×8): qty 2

## 2015-11-11 MED ORDER — HYDROCORTISONE 1 % EX CREA
TOPICAL_CREAM | Freq: Four times a day (QID) | CUTANEOUS | Status: DC | PRN
  Administered 2015-11-11 – 2015-11-12 (×2): via TOPICAL
  Filled 2015-11-11 (×2): qty 28

## 2015-11-11 NOTE — Lactation Note (Signed)
This note was copied from a baby's chart. Lactation Consultation Note  Initial visit made.  Mom has 35.1 week twins.  Twin A girl is with mom and Twin B boy was transferred to NICU this AM.  Mom has started pumping and hand expressing.  Instructed to pump every 2-3 hours x 15 minutes followed by hand expression.  Mom has a medela pump in style for home use.  Mom is very sleepy during visit and unable to keep her eyes open.  Another LC is present to assist with latching girl twin.  Providing Breastmilk For Your NICU Baby given to mom.  Patient Name: Julio AlmBoyB Amit Mason Today's Date: 11/11/2015 Reason for consult: Initial assessment;Multiple gestation;NICU baby;Late preterm infant   Maternal Data    Feeding Feeding Type: Formula Nipple Type: Slow - flow Length of feed: 20 min  LATCH Score/Interventions                      Lactation Tools Discussed/Used WIC Program: No Pump Review: Setup, frequency, and cleaning;Milk Storage Initiated by:: RN Date initiated:: 11/10/15   Consult Status Consult Status: Follow-up Date: 11/12/15 Follow-up type: In-patient    Huston FoleyMOULDEN, Kaiah Hosea S 11/11/2015, 4:06 PM

## 2015-11-11 NOTE — Lactation Note (Signed)
This note was copied from a baby's chart. Lactation Consultation Note  Patient Name: Catalina AntiguaGirlA Deshanda Mason WUJWJ'XToday's Date: 11/11/2015 Reason for consult: Follow-up assessment  2nd LC visit this afternoon - baby now 2619 1/2 hours old  Serum Blood Sugar drawn - 43 ,  Baby noted to be sluggish , LC attempted to latch the baby at the breast , attempt , no latch.  LC stimulated the baby to suck with a gloved finger , and then inserted slow flow nipple ,  From 1550 -1605 LC only could get baby to take 3 ml of formula,.  LC recommended lots of skin to skin. And to try snack feeding the baby until the baby stools and gradually the volume  Can be increased. MBU RN aware of feeding report and that baby is a Oceanographer[poor feeder .  3-11 p aware of the the feeding challenges.    Maternal Data Has patient been taught Hand Expression?: Yes Does the patient have breastfeeding experience prior to this delivery?: No  Feeding Feeding Type: Bottle Fed - Formula Nipple Type: Slow - flow  LATCH Score/Interventions Latch: Too sleepy or reluctant, no latch achieved, no sucking elicited. Intervention(s): Skin to skin;Teach feeding cues;Waking techniques Intervention(s): Adjust position;Assist with latch;Breast massage;Breast compression  Audible Swallowing: None  Type of Nipple: Everted at rest and after stimulation  Comfort (Breast/Nipple): Soft / non-tender     Hold (Positioning): Full assist, staff holds infant at breast Intervention(s): Breastfeeding basics reviewed  LATCH Score: 4  Lactation Tools Discussed/Used Tools: Pump Breast pump type: Double-Electric Breast Pump (MBU RN set up the DEBP this am and per mom has pumped x 1 with #24 Flange and per mom comfortable )   Consult Status Consult Status: Follow-up Date: 11/11/15 Follow-up type: In-patient    Kathrin Greathouseorio, Derrious Bologna Ann 11/11/2015, 4:22 PM

## 2015-11-11 NOTE — Progress Notes (Signed)
Subjective: Postpartum Day 1: Cesarean Delivery Patient reports tolerating PO.   Baby boy in NICU  Objective: Vital signs in last 24 hours: Temp:  [97 F (36.1 C)-98.4 F (36.9 C)] 98.1 F (36.7 C) (05/08 0530) Pulse Rate:  [56-104] 104 (05/08 0540) Resp:  [10-20] 20 (05/08 0530) BP: (96-140)/(58-95) 128/72 mmHg (05/08 0540) SpO2:  [87 %-100 %] 94 % (05/08 0530) Weight:  [202 lb (91.627 kg)] 202 lb (91.627 kg) (05/07 1442)  Physical Exam:  General: alert, cooperative and no distress Lochia: appropriate Uterine Fundus: firm Incision: healing well DVT Evaluation: No evidence of DVT seen on physical exam. Mild erythematous rash on abdomen   Recent Labs  11/10/15 1450 11/11/15 0528  HGB 13.1 12.0  HCT 38.2 35.2*    Assessment/Plan: Status post Cesarean section. Doing well postoperatively.  Continue current care Hydrocortisone cream.  Fremon Zacharia II,Ruwayda Curet E 11/11/2015, 7:46 AM

## 2015-11-11 NOTE — Anesthesia Postprocedure Evaluation (Signed)
Anesthesia Post Note  Patient: Angel [name]  Procedure(s) Performed: Procedure(s) (LRB): CESAREAN SECTION (N/A)  Patient location during evaluation: Mother Baby Anesthesia Type: Spinal Level of consciousness: awake, awake and alert and oriented Pain management: pain level controlled Vital Signs Assessment: post-procedure vital signs reviewed and stable Respiratory status: spontaneous breathing Cardiovascular status: stable Postop Assessment: patient able to bend at knees, no signs of nausea or vomiting and adequate PO intake Anesthetic complications: no     Last Vitals:  Filed Vitals:   11/11/15 0845 11/11/15 1300  BP: 97/56 92/79  Pulse: 56 58  Temp: 36.6 C 36.8 C  Resp: 18 20    Last Pain:  Filed Vitals:   11/11/15 1457  PainSc: 2    Pain Goal:                 [name]

## 2015-11-11 NOTE — Lactation Note (Signed)
This note was copied from a baby's chart. Lactation Consultation Note  Patient Name: Angel [name] [name] AVWUJ'WToday's Date: 11/11/2015 Reason for consult: Initial assessment;Other (Comment);Infant < 6lbs;Late preterm infant;Multiple gestation (LC placed baby skin to skin on moms chest, waiting for Serum blood sugar to be drawn by lab, mom aware to call for LC on hte nurses light )  Baby is 18 1/2 hours old , has had 5 ml , 3 ml EBM , and supplemented with formula 3 ml -1 ml - 8 ml.  Dr. Ave Filterhandler aware and Serum Blood sugar ordered. Mom aware to call after blood glucose has been drawn.  LC changed a large wet diaper.  MBU RN Celene Squibbonna Ware aware .  @ the start  [facility] consult mom had just got'en back from visiting baby B in NICU and presently eating her lunch.  [facility] during her lunch.  LC reviewed potential feeding behaviors with >5 pounds , LPT ( 35 1/7 week) infant and the importance of feeding at least every 3 hours and with feeding cues.  Not to stress the baby out trying to latch ( 10 mins max for trying ), and then supplement . LC explained to mom sometimes these LPT infants due better if you  Feed supplement 1st and then latch, boost their energy level up . Also gradual increase of calories is important to maintain blood sugar , energy level , and to prevent  Large weight losses. Grand[facility] and both asked to be set up. LC set up and instructed on the use of the DEBP    Maternal Data Has patient been taught Hand Expression?: Yes (per mom was shown by RN caring for her and actually able to get more than with pumping ) Does the patient have breastfeeding experience prior to this delivery?: No  Feeding Feeding Type: Bottle Fed - Formula Nipple Type: Slow - flow Length of feed: 15 min  LATCH Score/Interventions                      Lactation Tools Discussed/Used Tools: Pump Breast pump type: Double-Electric Breast Pump  (MBU RN set up the DEBP this am and per mom has pumped x 1 with #24 Flange and per mom comfortable )   Consult Status Consult Status: Follow-up Date: 11/11/15 Follow-up type: In-patient    Kathrin Greathouseorio, Aarion Kittrell Ann 11/11/2015, 3:27 PM

## 2015-11-11 NOTE — Addendum Note (Signed)
Addendum  created 11/11/15 1632 by Elgie CongoNataliya H Shatima Zalar, CRNA   Modules edited: Clinical Notes   Clinical Notes:  File: [id number]

## 2015-11-11 NOTE — Op Note (Signed)
NAME:  Angel [name], [name]                ACCOUNT NO.:  [id number]  MEDICAL RECORD NO.:  [id number]  LOCATION:  9103                          FACILITY:  [facility]  PHYSICIAN:  Nevea Spiewak L. Brittani Purdum, M.D.DATE OF BIRTH:  08-28-1990  DATE OF PROCEDURE:  11/10/2015 DATE OF DISCHARGE:                              OPERATIVE REPORT   PREOPERATIVE DIAGNOSIS:  Intrauterine pregnancy at 35-0/1, twin pregnancy labor, vertex-vertex, and declines labor.  POSTOPERATIVE DIAGNOSIS:  Intrauterine pregnancy at 35-0/1, twin pregnancy labor, vertex-vertex, and declines labor.  PROCEDURE:  Primary low transverse cesarean section.  SURGEON:  Dillie Burandt L. Stephenson Cichy, MD  ANESTHESIA:  Spinal.  EBL:  500 mL.  COMPLICATIONS:  None.  DRAINS:  Foley.  SPECIMEN:  Placenta sent to Pathology.  PROCEDURE IN DETAIL:  Patient was taken to the operating room.  Her spinal was placed.  She was prepped and draped in usual sterile fashion. A Foley catheter was inserted.  A low transverse incision was made, carried down to the fascia.  Fascia scored in the midline, extended laterally.  Rectus muscles were separated in the midline.  Peritoneum was entered bluntly.  The peritoneal incision was then stretched.  The bladder blade was then inserted.  The bladder flap was created and the low transverse incision was made using a hemostat.  The baby was in cephalic presentation, was delivered easily.  Baby A was a female infant, Apgars 9 at 1 minute, 9 at 5 minutes.  Baby B came down vertex as well.  The amniotic sac was ruptured and amniotic fluid was clear, was delivered easily with 1 gentle pull of the vacuum, was a female infant, Apgars and 9 at 1 minute and 9 at 5 minutes.  The cord was clamped and cut.  The baby was handed to the awaiting neonatal team. The placentas were manually removed after cord blood was obtained.  The uterus was exteriorized.  It was cleared of all clots and debris.  The uterine incision was closed in 1 layer  using 0 chromic in running, locked stitch.  Irrigation was performed.  The peritoneum was closed using 0 Vicryl.  The fascia was closed using 0 Vicryl in a running stitch x2 starting each corner meeting in the midline.  After the hemostasis noted in the subcutaneous layer, the skin was closed with 4-0 Vicryl on a Keith needle.  All sponge, lap, and instrument counts were correct x2. Patient went to recovery room in stable condition.     Jenascia Bumpass L. Vincente PoliGrewal, M.D.     Florestine AversMLG/MEDQ  D:  11/10/2015  T:  11/11/2015  Job:  [id number]

## 2015-11-12 MED ORDER — BISACODYL 10 MG RE SUPP
10.0000 mg | Freq: Every day | RECTAL | Status: DC | PRN
  Administered 2015-11-12: 10 mg via RECTAL
  Filled 2015-11-12: qty 1

## 2015-11-12 MED ORDER — BISACODYL 5 MG PO TBEC
5.0000 mg | DELAYED_RELEASE_TABLET | Freq: Every day | ORAL | Status: DC | PRN
  Filled 2015-11-12: qty 1

## 2015-11-12 MED ORDER — POLYETHYLENE GLYCOL 3350 17 G PO PACK
17.0000 g | PACK | Freq: Every day | ORAL | Status: DC | PRN
  Administered 2015-11-12 – 2015-11-13 (×2): 17 g via ORAL
  Filled 2015-11-12 (×4): qty 1

## 2015-11-12 NOTE — Lactation Note (Signed)
This note was copied from a baby's chart. Lactation Consultation Note  Patient Name: Angel [name] UJWJX'BToday's Date: 11/12/2015 Reason for consult: Follow-up assessment   With this mom of now 35 3/7 weeks CGa twins, in NIICU  Due to poor feeding and low blood sugars. They are now 2647 hours old. Mom has been doing hand expression, but has not been pumping. She expressed about 1 ml each time.I explained to mom how pumping every 3 hours, 8 times a day, was important, followed by hand expression. 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 NICU book on providing EBm for her baby reviewed with her. I encouraged mom to do skin to skin when she holds her babies, even if she has to hold one each visit, and to hold them for at least an hour. Mom knows to call lactation for help with breastfeeding, or questions/concerns.     Maternal Data Formula Feeding for Exclusion: Yes (babies are in NICU)  Feeding Feeding Type: Formula Nipple Type: Slow - flow Length of feed: 20 min  LATCH Score/Interventions                      Lactation Tools Discussed/Used Pump Review: Setup, frequency, and cleaning;Milk Storage;Other (comment) (Haned expression and review of NICU booklet, initiation setting on DEP) Initiated by:: bedside RN   Consult Status Consult Status: Follow-up Date: 11/13/15 Follow-up type: In-patient    [name], [name] 11/12/2015, 7:25 PM

## 2015-11-12 NOTE — Progress Notes (Signed)
Due to staffing limitations, CSW unavailable to meet with family at this time.  Please call CSW if acute concerns arise or by parents' request. 

## 2015-11-12 NOTE — Progress Notes (Signed)
Subjective: Postpartum Day 2: Cesarean Delivery Patient reports tolerating PO and no problems voiding.   Both babies in NICU, low blood sugars, female infant noted with tachypnea Objective: Vital signs in last 24 hours: Temp:  [97.8 F (36.6 C)-98.7 F (37.1 C)] 98 F (36.7 C) (05/09 0619) Pulse Rate:  [54-80] 80 (05/09 0619) Resp:  [18-20] 20 (05/09 0619) BP: (92-129)/(56-88) 129/88 mmHg (05/09 0619) SpO2:  [94 %-100 %] 100 % (05/08 1802)  Physical Exam:  General: alert and cooperative Lochia: appropriate Uterine Fundus: firm Incision: healing well DVT Evaluation: No evidence of DVT seen on physical exam. Negative Homan's sign. No cords or calf tenderness. Calf/Ankle edema is present.   Recent Labs  11/10/15 1450 11/11/15 0528  HGB 13.1 12.0  HCT 38.2 35.2*    Assessment/Plan: Status post Cesarean section. Doing well postoperatively.  Continue current care.  CURTIS,CAROL G 11/12/2015, 7:56 AM

## 2015-11-13 ENCOUNTER — Other Ambulatory Visit: Payer: 59

## 2015-11-13 LAB — COMPREHENSIVE METABOLIC PANEL
ALT: 12 U/L — AB (ref 14–54)
ANION GAP: 4 — AB (ref 5–15)
AST: 21 U/L (ref 15–41)
Albumin: 2.5 g/dL — ABNORMAL LOW (ref 3.5–5.0)
Alkaline Phosphatase: 170 U/L — ABNORMAL HIGH (ref 38–126)
BUN: 7 mg/dL (ref 6–20)
CHLORIDE: 106 mmol/L (ref 101–111)
CO2: 27 mmol/L (ref 22–32)
CREATININE: 0.59 mg/dL (ref 0.44–1.00)
Calcium: 8.4 mg/dL — ABNORMAL LOW (ref 8.9–10.3)
Glucose, Bld: 84 mg/dL (ref 65–99)
POTASSIUM: 3.4 mmol/L — AB (ref 3.5–5.1)
Sodium: 137 mmol/L (ref 135–145)
Total Bilirubin: 0.6 mg/dL (ref 0.3–1.2)
Total Protein: 5.5 g/dL — ABNORMAL LOW (ref 6.5–8.1)

## 2015-11-13 LAB — CBC
HCT: 35 % — ABNORMAL LOW (ref 36.0–46.0)
Hemoglobin: 11.5 g/dL — ABNORMAL LOW (ref 12.0–15.0)
MCH: 28.6 pg (ref 26.0–34.0)
MCHC: 32.9 g/dL (ref 30.0–36.0)
MCV: 87.1 fL (ref 78.0–100.0)
PLATELETS: 171 10*3/uL (ref 150–400)
RBC: 4.02 MIL/uL (ref 3.87–5.11)
RDW: 13.9 % (ref 11.5–15.5)
WBC: 10 10*3/uL (ref 4.0–10.5)

## 2015-11-13 MED ORDER — OXYCODONE HCL 5 MG PO TABS
5.0000 mg | ORAL_TABLET | ORAL | Status: DC | PRN
  Administered 2015-11-14 (×2): 5 mg via ORAL
  Filled 2015-11-13: qty 1

## 2015-11-13 MED ORDER — OXYCODONE HCL 5 MG PO TABS
10.0000 mg | ORAL_TABLET | ORAL | Status: DC | PRN
  Administered 2015-11-13 – 2015-11-14 (×4): 10 mg via ORAL
  Filled 2015-11-13 (×4): qty 2

## 2015-11-13 NOTE — Lactation Note (Signed)
This note was copied from a baby's chart. Lactation Consultation Note  Patient Name: Julio AlmBoyB Shantoya Mason MWUXL'KToday's Date: 11/13/2015 Reason for consult: Follow-up assessment;NICU baby;Multiple gestation NICU twins 3165 hours old, 10965w1d GA. Mom reports that she just attempted to nurse each baby, and is now attempting to feed baby boy "B" with a bottle. Mom reports that she is pumping every 2-3 hours and has brought EBM to the NICU with this visit. Mom reports that she is getting a few ml when pumping. This LC's phone number placed on mom's erase board and mom enc to call for assistance as needed.  Maternal Data    Feeding Feeding Type: Formula (about 2 ml of breast milk, mostly formula) Nipple Type: Slow - flow Length of feed: 20 min  LATCH Score/Interventions Latch: Repeated attempts needed to sustain latch, nipple held in mouth throughout feeding, stimulation needed to elicit sucking reflex. Intervention(s): Adjust position (infant sleepy)  Audible Swallowing: A few with stimulation  Type of Nipple: Everted at rest and after stimulation  Comfort (Breast/Nipple): Soft / non-tender     Hold (Positioning): No assistance needed to correctly position infant at breast.  LATCH Score: 8  Lactation Tools Discussed/Used     Consult Status Consult Status: Follow-up Date: 11/14/15 Follow-up type: In-patient    Geralynn OchsWILLIARD, Cherina Dhillon 11/13/2015, 1:40 PM

## 2015-11-13 NOTE — Progress Notes (Signed)
Subjective: Postpartum Day 3: Cesarean Delivery Patient reports incisional pain, tolerating PO, + flatus and no problems voiding.   Denies HA,or RUQ pain Babies continue in NICU, female infant with NG tube Objective: Vital signs in last 24 hours: Temp:  [97.7 F (36.5 C)-98.3 F (36.8 C)] 97.7 F (36.5 C) (05/10 0635) Pulse Rate:  [86-88] 88 (05/10 0635) Resp:  [18] 18 (05/10 0635) BP: (99-130)/(76-90) 130/90 mmHg (05/10 16100635)  Physical Exam:  General: alert and cooperative Lochia: appropriate Uterine Fundus: firm Incision: healing well DVT Evaluation: No evidence of DVT seen on physical exam. Negative Homan's sign. No cords or calf tenderness. Calf/Ankle edema is present. DTR's 2-3+ no clonus   Recent Labs  11/10/15 1450 11/11/15 0528  HGB 13.1 12.0  HCT 38.2 35.2*    Assessment/Plan: Status post Cesarean section. Postoperative course complicated by slightl elevation in BP  CBC and CMP.  Plan to discharge tomorrow if bp and labs stable  Angel [name] G 11/13/2015, 8:08 AM

## 2015-11-14 MED ORDER — IBUPROFEN 600 MG PO TABS: 600.0000 mg | ORAL_TABLET | Freq: Four times a day (QID) | ORAL | Status: DC

## 2015-11-14 MED ORDER — OXYCODONE HCL 5 MG PO TABS: 5.0000 mg | ORAL_TABLET | ORAL | Status: DC | PRN

## 2015-11-14 NOTE — Lactation Note (Signed)
This note was copied from a baby's chart. Lactation Consultation Note  Patient Name: Catalina AntiguaGirlA Kharis Mason ZDGUY'QToday's Date: 11/14/2015 Reason for consult: Follow-up assessment;NICU baby;Infant < 6lbs;Late preterm infant;Multiple gestation   Follow up with mom in NICU. [facility]. Mom was BF Twin B Boy when I arrived. She had him latched to left breast in cradle position. Infant was nursing intermittently and noted to have a few swallows with feeding. Assisted mom in adjusting infant's position to tummy to tummy and deepening the latch, infant with better sucking pattern after adjustments. Infant did need stimulation to maintain suckling. Mom reports her milk is coming in. She reports she did not pump last night, but did hand express 16 cc for infants. Mom reports she has a pump for home use. Advised her to pump every 2-3 hours for stimulation and to hand express afterwards to stimulate production. Mom voiced understanding.   Mom reports she has pumping supplies for home use. Mom reports she has attempted to BF Zoe also and she tends to want to sleep at the breast. Advised mom what to expect with her LPT twins and to keep putting them to breast as they are able to tolerate and when mom is here at bedside.   Engorgement Prevention reviewed. Enc mom to call LC with questions/concerns. [facility]   Maternal Data Has patient been taught Hand Expression?: Yes  Feeding Feeding Type: Formula Length of feed: 30 min  LATCH Score/Interventions                      Lactation Tools Discussed/Used WIC Program: No Pump Review: Setup, frequency, and cleaning;Milk Storage   Consult Status Consult Status: PRN Follow-up type: Call as needed    Ed BlalockSharon S Ezekial Arns 11/14/2015, 12:31 PM

## 2015-11-14 NOTE — Discharge Summary (Signed)
Obstetric Discharge Summary Reason for Admission: onset of labor Prenatal Procedures: ultrasound Intrapartum Procedures: cesarean: low cervical, transverse Postpartum Procedures: none Complications-Operative and Postpartum: none HEMOGLOBIN  Date Value Ref Range Status  11/13/2015 11.5* 12.0 - 15.0 g/dL Final   HCT  Date Value Ref Range Status  11/13/2015 35.0* 36.0 - 46.0 % Final    Physical Exam:  General: alert and cooperative Lochia: appropriate Uterine Fundus: firm Incision: healing well DVT Evaluation: No evidence of DVT seen on physical exam. Negative Homan's sign. No cords or calf tenderness. No significant calf/ankle edema.  Discharge Diagnoses: s/p cesarean delivery of twins at 3135 1/7 weeks  Discharge Information: Date: 11/14/2015 Activity: pelvic rest Diet: routine Medications: PNV, Ibuprofen and Percocet Condition: stable Instructions: refer to practice specific booklet Discharge to: home   Newborn Data:   Catalina AntiguaMason, GirlA Mirtha [[id number]][[id number]]  Live born female  Birth Weight: 4 lb 8 oz (2040 g) APGAR: 9, 9   Julio AlmMason, BoyB Naviyah [[id number]][[id number]]  Live born female  Birth Weight: 5 lb 15.6 oz (2710 g) APGAR: 9, 9    babies remain in NICU  Kennidi Yoshida G 11/14/2015, 8:12 AM

## 2015-11-16 ENCOUNTER — Ambulatory Visit: Payer: Self-pay

## 2015-11-16 NOTE — Lactation Note (Signed)
This note was copied from a baby's chart. Lactation Consultation Note  Patient Name: Angel [name] WUJWJ'XToday's Date: 11/16/2015 Reason for consult: Follow-up assessment;NICU baby;Multiple gestation;Late preterm infant;Infant < 6lbs LC was called by nurse to come help with latch & stated that inverted shells may help. Mom was latching Baby BoyB on her left breast in cradle hold; baby was having a difficult time maintaining a latch. Suggested mom try the cross-cradle hold & baby stayed for a little but came on & off. Mom has shorter nipples that do not evert fully while baby is trying to BF. Mom stated she hadn't pumped since this morning so was quite full. Mom switched back to cradle hold & did breast compressions while he suckled so he received milk but when she stopped compressing, he stopped suckling. Baby kept falling asleep. While mom was BF on her left breast, we put the shell in the bra of her right breast. After ~10 mins, suggested mom try latching baby on right breast to see if the shell helped any - her nipple was out a little more than her left. Mom stated she had not tried BF on her right breast so felt uncomfortable; suggested mom try cross-cradle & baby came on & off again without maintaining a latch. Mom then asked for her nurse to give him a bottle of her milk because he continued to fall asleep. Discussed how this stimulation to both breasts is very helpful & encouraged mom to continue trying when she was there. Suggested mom wear the breast shells for ~20 mins before BF; discussed how to use & clean the shells. Mom reports no other questions at this time.  Maternal Data    Feeding Feeding Type: Breast Fed Length of feed: 30 min  LATCH Score/Interventions Latch: Repeated attempts needed to sustain latch, nipple held in mouth throughout feeding, stimulation needed to elicit sucking reflex. Intervention(s): Assist with latch;Adjust position;Breast compression  Audible Swallowing:  None Intervention(s): Skin to skin Intervention(s): Alternate breast massage  Type of Nipple: Flat Intervention(s): Shells  Comfort (Breast/Nipple): Soft / non-tender     Hold (Positioning): Assistance needed to correctly position infant at breast and maintain latch. Intervention(s): Support Pillows;Position options  LATCH Score: 5  Lactation Tools Discussed/Used Tools: Shells Shell Type: Inverted   Consult Status Consult Status: PRN Follow-up type: In-patient    Oneal GroutLaura C Daryel Kenneth 11/16/2015, 4:28 PM

## 2015-11-18 ENCOUNTER — Ambulatory Visit: Payer: Self-pay

## 2015-11-18 NOTE — Lactation Note (Signed)
This note was copied from a baby's chart. Lactation Consultation Note  Patient Name: Julio AlmBoyB Anntionette Mason ZOXWR'UToday's Date: 11/18/2015 Reason for consult: Follow-up assessment;NICU baby;Late preterm infant;Infant < 6lbs;Multiple gestation   Follow up with mom at infants bedside in NICU. Mom reports she is pumping every 4-5 hours. Enc mom to pump every 2-3 hours to maintain milk supply. Mom reports she is getting 3-5 oz/pumping. She is putting infant boy to breast and reports infant girl is becoming more alert. Enc her to call for assistance as needed. Mom without questions at this time.    Maternal Data    Feeding Feeding Type: Breast Milk Nipple Type: Slow - flow Length of feed: 30 min  LATCH Score/Interventions                      Lactation Tools Discussed/Used Pump Review: Setup, frequency, and cleaning   Consult Status Consult Status: PRN Follow-up type: Call as needed    Ed BlalockSharon S Sieanna Vanstone 11/18/2015, 3:46 PM

## 2015-11-19 ENCOUNTER — Ambulatory Visit: Payer: Self-pay

## 2015-11-19 NOTE — Lactation Note (Signed)
This note was copied from a baby's chart. Lactation Consultation Note  Patient Name: Catalina AntiguaGirlA Oni Mason WGNFA'OToday's Date: 11/19/2015 Reason for consult: Follow-up assessment;NICU baby NICU baby 39 days old. Mom reports that she has put baby boy "B" to breast a couple of times, but this is the first time baby girl "A" is attempting to nurse at breast. Assisted mom to latch baby to left breast in cross-cradle position, but baby sleepy at breast. Baby would actively suckle this LC's gloved finger, and then would attempt to latch to mom's breast, but would stop trying to latch after a couple of suckles. Mom dribbled some EBM into the baby's mouth, and baby attempted to latch several more times, but was not able to achieve a latch. Fitted mom with a #20 NS and demonstrated to mom how to position baby in football position. Baby fussy at breast and not wanting to be handled any longer. Discussed with mom that the NS a temporary devise, and that the shield is just a device to get the babies latching and nursing directly at the breast. Mom states that she wanted to give the baby the prepared bottle of EBM now, but will attempt to latch the baby again at another feeding. Enc mom to call for assistance as needed.    Maternal Data    Feeding Feeding Type: Breast Fed Nipple Type: Slow - flow Length of feed: 0 min  LATCH Score/Interventions                      Lactation Tools Discussed/Used Tools: Nipple Shields Nipple shield size: 20   Consult Status Consult Status: PRN    Geralynn OchsWILLIARD, Charae [place] 11/19/2015, 11:30 AM

## 2015-11-26 ENCOUNTER — Ambulatory Visit: Payer: Self-pay

## 2015-11-26 NOTE — Lactation Note (Signed)
This note was copied from a baby's chart. Lactation Consultation Note     I briefly saw this mom of NICU twins, now 1092 weeks old, and 37 3/7 weeks CGA. Mom plans to breast feed babies eventually. She has not been pumping at night, and is able to get 8-12 ounces in the morning. I reviewed with mom the importance of pumping around the clock, and how not pumping at night will cause her body to wean. Mom was concerned. I told her that if she does as advised with pumping, she will see a decrease in the amount at each pumping, but overall should still be making at least as much as she was by pumping during the day only. Over time, hopefully she will see a daily total  increase in supply. Mom knows to call for lactation help as needed, and o/p lactation reviewed with mom also.   Patient Name: Angel [name] Annalee Mason Today's Date: 11/26/2015     Maternal Data    Feeding Feeding Type: Breast Milk Nipple Type: Slow - flow Length of feed: 20 min  LATCH Score/Interventions                      Lactation Tools Discussed/Used     Consult Status      [name], Johathon Overturf Anne 11/26/2015, 4:23 PM

## 2015-11-27 ENCOUNTER — Ambulatory Visit: Payer: Self-pay

## 2015-11-27 NOTE — Lactation Note (Signed)
This note was copied from a baby's chart. Lactation Consultation Note  Patient Name: Angel [name]: 11/27/2015 Reason for consult: Follow-up assessment;NICU baby;Late preterm infant;Multiple gestation   Follow up with mom in NICU. Mom reports she is pumping during the day and sleeping 8 hours at night. Informed her of power pumping and encouraged her to do prior to going to bed or early in the mornings. Mom reports she pumped 8 oz this morning. Infants are being supplemented to gt higher calorie formula. Mom reports she has not been putting the infants to the breast. Enc her to ry to BF at least once or twice a day and give supplement afterwards. Mom may room in tonight with plans for infants to go home tomorrow. Mom may be interested in OP appt after d/c.    Maternal Data    Feeding Feeding Type: Breast Milk Nipple Type: Slow - flow Length of feed: 30 min  LATCH Score/Interventions                      Lactation Tools Discussed/Used Pump Review: Setup, frequency, and cleaning   Consult Status Consult Status: Follow-up [name]: 11/21/15 Follow-up type: In-patient    Angel FloodSharon S Tandre [name] 11/27/2015, 9:46 AM

## 2015-12-25 DIAGNOSIS — Z1389 Encounter for screening for other disorder: Secondary | ICD-10-CM | POA: Diagnosis not present

## 2015-12-25 DIAGNOSIS — Z113 Encounter for screening for infections with a predominantly sexual mode of transmission: Secondary | ICD-10-CM | POA: Diagnosis not present

## 2015-12-25 DIAGNOSIS — Z30430 Encounter for insertion of intrauterine contraceptive device: Secondary | ICD-10-CM | POA: Diagnosis not present

## 2015-12-31 DIAGNOSIS — F909 Attention-deficit hyperactivity disorder, unspecified type: Secondary | ICD-10-CM | POA: Insufficient documentation

## 2015-12-31 DIAGNOSIS — F419 Anxiety disorder, unspecified: Secondary | ICD-10-CM | POA: Insufficient documentation

## 2015-12-31 DIAGNOSIS — J309 Allergic rhinitis, unspecified: Secondary | ICD-10-CM | POA: Insufficient documentation

## 2016-01-01 DIAGNOSIS — B850 Pediculosis due to Pediculus humanus capitis: Secondary | ICD-10-CM | POA: Diagnosis not present

## 2016-01-09 DIAGNOSIS — K429 Umbilical hernia without obstruction or gangrene: Secondary | ICD-10-CM | POA: Diagnosis not present

## 2016-01-13 DIAGNOSIS — Z30431 Encounter for routine checking of intrauterine contraceptive device: Secondary | ICD-10-CM | POA: Diagnosis not present

## 2016-02-07 DIAGNOSIS — K429 Umbilical hernia without obstruction or gangrene: Secondary | ICD-10-CM | POA: Diagnosis not present

## 2016-03-13 DIAGNOSIS — J0100 Acute maxillary sinusitis, unspecified: Secondary | ICD-10-CM | POA: Diagnosis not present

## 2016-05-19 DIAGNOSIS — J209 Acute bronchitis, unspecified: Secondary | ICD-10-CM | POA: Diagnosis not present

## 2016-07-31 DIAGNOSIS — Z30432 Encounter for removal of intrauterine contraceptive device: Secondary | ICD-10-CM | POA: Diagnosis not present

## 2016-07-31 DIAGNOSIS — Z30013 Encounter for initial prescription of injectable contraceptive: Secondary | ICD-10-CM | POA: Diagnosis not present

## 2016-10-21 DIAGNOSIS — Z3042 Encounter for surveillance of injectable contraceptive: Secondary | ICD-10-CM | POA: Diagnosis not present

## 2016-11-11 DIAGNOSIS — F39 Unspecified mood [affective] disorder: Secondary | ICD-10-CM | POA: Diagnosis not present

## 2016-11-11 DIAGNOSIS — F419 Anxiety disorder, unspecified: Secondary | ICD-10-CM | POA: Diagnosis not present

## 2016-11-11 DIAGNOSIS — F329 Major depressive disorder, single episode, unspecified: Secondary | ICD-10-CM | POA: Diagnosis not present

## 2016-11-18 DIAGNOSIS — F411 Generalized anxiety disorder: Secondary | ICD-10-CM | POA: Diagnosis not present

## 2016-11-18 DIAGNOSIS — F902 Attention-deficit hyperactivity disorder, combined type: Secondary | ICD-10-CM | POA: Diagnosis not present

## 2016-11-18 DIAGNOSIS — F329 Major depressive disorder, single episode, unspecified: Secondary | ICD-10-CM | POA: Diagnosis not present

## 2016-11-18 DIAGNOSIS — F4310 Post-traumatic stress disorder, unspecified: Secondary | ICD-10-CM | POA: Diagnosis not present

## 2016-12-14 DIAGNOSIS — B850 Pediculosis due to Pediculus humanus capitis: Secondary | ICD-10-CM | POA: Diagnosis not present

## 2016-12-14 DIAGNOSIS — J069 Acute upper respiratory infection, unspecified: Secondary | ICD-10-CM | POA: Diagnosis not present

## 2016-12-17 DIAGNOSIS — F329 Major depressive disorder, single episode, unspecified: Secondary | ICD-10-CM | POA: Diagnosis not present

## 2016-12-17 DIAGNOSIS — F900 Attention-deficit hyperactivity disorder, predominantly inattentive type: Secondary | ICD-10-CM | POA: Diagnosis not present

## 2016-12-17 DIAGNOSIS — F411 Generalized anxiety disorder: Secondary | ICD-10-CM | POA: Diagnosis not present

## 2017-01-18 DIAGNOSIS — Z01419 Encounter for gynecological examination (general) (routine) without abnormal findings: Secondary | ICD-10-CM | POA: Diagnosis not present

## 2017-01-18 DIAGNOSIS — Z6833 Body mass index (BMI) 33.0-33.9, adult: Secondary | ICD-10-CM | POA: Diagnosis not present

## 2017-01-21 DIAGNOSIS — F900 Attention-deficit hyperactivity disorder, predominantly inattentive type: Secondary | ICD-10-CM | POA: Diagnosis not present

## 2017-01-21 DIAGNOSIS — F411 Generalized anxiety disorder: Secondary | ICD-10-CM | POA: Diagnosis not present

## 2017-01-21 DIAGNOSIS — F329 Major depressive disorder, single episode, unspecified: Secondary | ICD-10-CM | POA: Diagnosis not present

## 2017-03-05 DIAGNOSIS — F900 Attention-deficit hyperactivity disorder, predominantly inattentive type: Secondary | ICD-10-CM | POA: Diagnosis not present

## 2017-03-05 DIAGNOSIS — F411 Generalized anxiety disorder: Secondary | ICD-10-CM | POA: Diagnosis not present

## 2017-03-05 DIAGNOSIS — F329 Major depressive disorder, single episode, unspecified: Secondary | ICD-10-CM | POA: Diagnosis not present

## 2017-03-25 DIAGNOSIS — Z3009 Encounter for other general counseling and advice on contraception: Secondary | ICD-10-CM | POA: Diagnosis not present

## 2017-03-25 DIAGNOSIS — Z3202 Encounter for pregnancy test, result negative: Secondary | ICD-10-CM | POA: Diagnosis not present

## 2017-04-28 DIAGNOSIS — Z3009 Encounter for other general counseling and advice on contraception: Secondary | ICD-10-CM | POA: Diagnosis not present

## 2017-05-03 DIAGNOSIS — N3001 Acute cystitis with hematuria: Secondary | ICD-10-CM | POA: Diagnosis not present

## 2017-05-05 ENCOUNTER — Ambulatory Visit (HOSPITAL_COMMUNITY)
Admission: RE | Admit: 2017-05-05 | Discharge: 2017-05-05 | Disposition: A | Discharge status: Home / Self Care | Payer: 59 | Source: Ambulatory Visit | Attending: Urology | Admitting: Urology

## 2017-05-05 ENCOUNTER — Other Ambulatory Visit: Payer: Self-pay | Admitting: Urology

## 2017-05-05 DIAGNOSIS — R109 Unspecified abdominal pain: Secondary | ICD-10-CM | POA: Diagnosis not present

## 2017-05-05 DIAGNOSIS — M546 Pain in thoracic spine: Secondary | ICD-10-CM | POA: Diagnosis not present

## 2017-05-05 DIAGNOSIS — K8020 Calculus of gallbladder without cholecystitis without obstruction: Secondary | ICD-10-CM | POA: Insufficient documentation

## 2017-05-05 DIAGNOSIS — K439 Ventral hernia without obstruction or gangrene: Secondary | ICD-10-CM | POA: Insufficient documentation

## 2017-05-05 DIAGNOSIS — N200 Calculus of kidney: Secondary | ICD-10-CM

## 2017-05-05 DIAGNOSIS — N390 Urinary tract infection, site not specified: Secondary | ICD-10-CM | POA: Diagnosis not present

## 2017-05-05 DIAGNOSIS — N201 Calculus of ureter: Secondary | ICD-10-CM | POA: Diagnosis not present

## 2017-05-05 DIAGNOSIS — R350 Frequency of micturition: Secondary | ICD-10-CM | POA: Diagnosis not present

## 2017-05-05 DIAGNOSIS — M545 Low back pain: Secondary | ICD-10-CM | POA: Diagnosis not present

## 2017-05-10 DIAGNOSIS — J069 Acute upper respiratory infection, unspecified: Secondary | ICD-10-CM | POA: Diagnosis not present

## 2017-05-13 DIAGNOSIS — F900 Attention-deficit hyperactivity disorder, predominantly inattentive type: Secondary | ICD-10-CM | POA: Diagnosis not present

## 2017-05-13 DIAGNOSIS — F329 Major depressive disorder, single episode, unspecified: Secondary | ICD-10-CM | POA: Diagnosis not present

## 2017-05-13 DIAGNOSIS — F411 Generalized anxiety disorder: Secondary | ICD-10-CM | POA: Diagnosis not present

## 2017-07-08 DIAGNOSIS — R05 Cough: Secondary | ICD-10-CM | POA: Diagnosis not present

## 2017-07-08 DIAGNOSIS — B349 Viral infection, unspecified: Secondary | ICD-10-CM | POA: Diagnosis not present

## 2017-07-08 DIAGNOSIS — J029 Acute pharyngitis, unspecified: Secondary | ICD-10-CM | POA: Diagnosis not present

## 2017-07-19 DIAGNOSIS — H01001 Unspecified blepharitis right upper eyelid: Secondary | ICD-10-CM | POA: Diagnosis not present

## 2017-07-23 DIAGNOSIS — H00011 Hordeolum externum right upper eyelid: Secondary | ICD-10-CM | POA: Diagnosis not present

## 2017-09-22 DIAGNOSIS — H52223 Regular astigmatism, bilateral: Secondary | ICD-10-CM | POA: Diagnosis not present

## 2017-09-22 DIAGNOSIS — H5213 Myopia, bilateral: Secondary | ICD-10-CM | POA: Diagnosis not present

## 2017-11-11 ENCOUNTER — Other Ambulatory Visit: Payer: Self-pay | Admitting: Obstetrics & Gynecology

## 2017-11-11 DIAGNOSIS — O3680X0 Pregnancy with inconclusive fetal viability, not applicable or unspecified: Secondary | ICD-10-CM

## 2017-11-15 ENCOUNTER — Other Ambulatory Visit: Payer: Self-pay | Admitting: Obstetrics & Gynecology

## 2017-11-15 ENCOUNTER — Ambulatory Visit (INDEPENDENT_AMBULATORY_CARE_PROVIDER_SITE_OTHER): Payer: Medicaid Other

## 2017-11-15 DIAGNOSIS — O3481 Maternal care for other abnormalities of pelvic organs, first trimester: Secondary | ICD-10-CM

## 2017-11-15 DIAGNOSIS — N8312 Corpus luteum cyst of left ovary: Secondary | ICD-10-CM | POA: Diagnosis not present

## 2017-11-15 DIAGNOSIS — O3680X0 Pregnancy with inconclusive fetal viability, not applicable or unspecified: Secondary | ICD-10-CM

## 2017-11-15 DIAGNOSIS — Z3A01 Less than 8 weeks gestation of pregnancy: Secondary | ICD-10-CM | POA: Diagnosis not present

## 2017-11-15 NOTE — Progress Notes (Signed)
[place] 5+6 wks,single IUP w/ys,positive fht 105 bpm,normal ovaries bilat,simple left corpus luteal cyst 2.3 x 1.9 x 1.6 cm,crl 3.1 mm,EDD 07/12/2018

## 2017-12-01 ENCOUNTER — Ambulatory Visit: Payer: Self-pay | Admitting: *Deleted

## 2017-12-01 ENCOUNTER — Encounter: Payer: Self-pay | Admitting: Women's Health

## 2017-12-08 LAB — OB RESULTS CONSOLE GC/CHLAMYDIA
Chlamydia: NEGATIVE
Gonorrhea: NEGATIVE

## 2017-12-08 LAB — OB RESULTS CONSOLE ABO/RH: RH Type: POSITIVE

## 2017-12-08 LAB — OB RESULTS CONSOLE HIV ANTIBODY (ROUTINE TESTING): HIV: NONREACTIVE

## 2017-12-08 LAB — OB RESULTS CONSOLE RPR: RPR: NONREACTIVE

## 2017-12-08 LAB — OB RESULTS CONSOLE RUBELLA ANTIBODY, IGM: Rubella: IMMUNE

## 2017-12-08 LAB — OB RESULTS CONSOLE ANTIBODY SCREEN: Antibody Screen: NEGATIVE

## 2017-12-08 LAB — OB RESULTS CONSOLE HEPATITIS B SURFACE ANTIGEN: Hepatitis B Surface Ag: NEGATIVE

## 2018-06-16 NOTE — H&P (Addendum)
Angel [name] is a 27 y.o. female presenting for scheduled rCS with BTL. H/o prior CS for twins c/b PTL and PTB. She has a h/o asthma and anxiety. Has been on zoloft with good control of anxiety. She has ah/o abdominal hernia with plans for repair pp with her general surgeon.    OB History    Gravida  2   Para  1   Term      Preterm  1   AB      Living  2     SAB      TAB      Ectopic      Multiple  1   Live Births  2          Past Medical History:  Diagnosis Date  . Asthma   . Constipation    Past Surgical History:  Procedure Laterality Date  . arm surgery    . CESAREAN SECTION N/A 11/10/2015   Procedure: CESAREAN SECTION;  Surgeon: Marcelle OverlieMichelle Grewal, MD;  [facility];  Service: Obstetrics;  Laterality: N/A;   Family History: family history includes Arthritis in her mother; Asthma in her mother; Diabetes in her father. Social History:  reports that she has quit smoking. Her smoking use included cigarettes. She smoked 1.00 pack per day. She has never used smokeless tobacco. She reports that she does not drink alcohol or use drugs.     Maternal Diabetes: No Genetic Screening: Normal Maternal Ultrasounds/Referrals: Normal Fetal Ultrasounds or other Referrals:  None Maternal Substance Abuse:  No Significant Maternal Medications:  None Significant Maternal Lab Results:  None Other Comments:  None  ROS History   unknown if currently breastfeeding. Exam Physical Exam  (from office) NAD, A&O NWOB Abd soft, nondistended, gravid, abdominal hernia to R of midline palpated at NOB not able to be palpated  Prenatal labs: ABO, Rh:   Antibody:   Rubella:   RPR:    HBsAg:    HIV:    GBS:   neg  Assessment/Plan: [age] yo presenting for scheduled repeat CS with requested bilateral tubal ligation. Risks discussed including infection, bleeding, damage to surrounding structures, the need for additional procedures including hysterectomy, and the  possibility of uterine rupture with neonatal morbidity/mortality, scarring, and abnormal placentation with subsequent pregnancies. We also discussed bilateral tubal ligation in detail. Although patient is <30 yo, this is her third child and she is done with child bearing. She has requested a BTL to multiple providers and is sure of her choice. We specifically reviewed risks of regret being high given her age and she understands. We also addressed the risk of failure and ectopic pregnancy  And it was discussed that this is considered permanent.  The alternatives to permanent sterilization were reviewed and she understands and agrees to proceed.   Gent/clinda on call to OR given h/o ancef allergy  Angel [name] 06/16/2018, 1:30 PM No updates to above H&P. Patient arrived NPO and was consented in PACU. Risks again discussed, all questions answered, and consent signed. Proceed with above surgery.   Angel AgeeElise Yulieth Carrender MD

## 2018-06-20 ENCOUNTER — Encounter (HOSPITAL_COMMUNITY): Payer: Self-pay

## 2018-07-04 ENCOUNTER — Encounter (HOSPITAL_COMMUNITY)
Admission: RE | Admit: 2018-07-04 | Discharge: 2018-07-04 | Disposition: A | Discharge status: Home / Self Care | Payer: Medicaid Other | Source: Ambulatory Visit | Attending: Obstetrics and Gynecology | Admitting: Obstetrics and Gynecology

## 2018-07-04 LAB — CBC
HCT: 36.8 % (ref 36.0–46.0)
Hemoglobin: 11.6 g/dL — ABNORMAL LOW (ref 12.0–15.0)
MCH: 25.1 pg — ABNORMAL LOW (ref 26.0–34.0)
MCHC: 31.5 g/dL (ref 30.0–36.0)
MCV: 79.5 fL — ABNORMAL LOW (ref 80.0–100.0)
Platelets: 203 10*3/uL (ref 150–400)
RBC: 4.63 MIL/uL (ref 3.87–5.11)
RDW: 15.1 % (ref 11.5–15.5)
WBC: 10.8 10*3/uL — ABNORMAL HIGH (ref 4.0–10.5)
nRBC: 0 % (ref 0.0–0.2)

## 2018-07-04 LAB — TYPE AND SCREEN
ABO/RH(D): O POS
Antibody Screen: NEGATIVE

## 2018-07-04 NOTE — Patient Instructions (Signed)
Angel [name]  07/04/2018   Your procedure is scheduled on:  07/05/2018  [facility].  Pick up the phone at the desk and dial [phone]  Call this number if you have problems the morning of surgery:[phone]  Remember:   Do not eat food:(After Midnight) Desps de medianoche.  Do not drink clear liquids: (After Midnight) Desps de medianoche.  Take these medicines the morning of surgery with A SIP OF WATER: zoloft and bring your inhaler   Do not wear jewelry, make-up or nail polish.  Do not wear lotions, powders, or perfumes. Do not wear deodorant.  Do not shave 48 hours prior to surgery.  Do not bring valuables to the hospital.  [facility] is not   responsible for any belongings or valuables brought to the hospital.  Contacts, dentures or bridgework may not be worn into surgery.  Leave suitcase in the car. After surgery it may be brought to your room.  For patients admitted to the hospital, checkout time is 11:00 AM the day of              discharge.    N/A   Please read over the following fact sheets that you were given:   Surgical Site Infection Prevention

## 2018-07-05 ENCOUNTER — Encounter (HOSPITAL_COMMUNITY): Payer: Self-pay | Admitting: *Deleted

## 2018-07-05 ENCOUNTER — Inpatient Hospital Stay (HOSPITAL_COMMUNITY)
Admission: RE | Admit: 2018-07-05 | Discharge: 2018-07-07 | DRG: 785 | Disposition: A | Discharge status: Home / Self Care | Payer: Medicaid Other | Attending: Obstetrics and Gynecology | Admitting: Obstetrics and Gynecology

## 2018-07-05 ENCOUNTER — Inpatient Hospital Stay (HOSPITAL_COMMUNITY): Payer: Medicaid Other

## 2018-07-05 ENCOUNTER — Encounter (HOSPITAL_COMMUNITY)
Admission: RE | Disposition: A | Discharge status: Home / Self Care | Payer: Self-pay | Source: Home / Self Care | Attending: Obstetrics and Gynecology

## 2018-07-05 DIAGNOSIS — O9952 Diseases of the respiratory system complicating childbirth: Secondary | ICD-10-CM | POA: Diagnosis present

## 2018-07-05 DIAGNOSIS — Z302 Encounter for sterilization: Secondary | ICD-10-CM

## 2018-07-05 DIAGNOSIS — O34211 Maternal care for low transverse scar from previous cesarean delivery: Principal | ICD-10-CM | POA: Diagnosis present

## 2018-07-05 DIAGNOSIS — Z87891 Personal history of nicotine dependence: Secondary | ICD-10-CM | POA: Diagnosis not present

## 2018-07-05 DIAGNOSIS — J45909 Unspecified asthma, uncomplicated: Secondary | ICD-10-CM | POA: Diagnosis present

## 2018-07-05 DIAGNOSIS — Z881 Allergy status to other antibiotic agents status: Secondary | ICD-10-CM | POA: Diagnosis not present

## 2018-07-05 DIAGNOSIS — Z3A39 39 weeks gestation of pregnancy: Secondary | ICD-10-CM

## 2018-07-05 DIAGNOSIS — F419 Anxiety disorder, unspecified: Secondary | ICD-10-CM | POA: Diagnosis present

## 2018-07-05 DIAGNOSIS — Z3490 Encounter for supervision of normal pregnancy, unspecified, unspecified trimester: Secondary | ICD-10-CM

## 2018-07-05 DIAGNOSIS — O99344 Other mental disorders complicating childbirth: Secondary | ICD-10-CM | POA: Diagnosis present

## 2018-07-05 LAB — RPR: RPR Ser Ql: NONREACTIVE

## 2018-07-05 SURGERY — Surgical Case
Anesthesia: Spinal | Wound class: Clean Contaminated

## 2018-07-05 MED ORDER — METOCLOPRAMIDE HCL 5 MG/ML IJ SOLN
INTRAMUSCULAR | Status: DC | PRN
  Administered 2018-07-05: 5 mg via INTRAVENOUS

## 2018-07-05 MED ORDER — ACETAMINOPHEN 500 MG PO TABS: 1000.0000 mg | ORAL_TABLET | Freq: Four times a day (QID) | ORAL | Status: DC

## 2018-07-05 MED ORDER — PRENATAL MULTIVITAMIN CH
1.0000 | ORAL_TABLET | Freq: Every day | ORAL | Status: DC
  Administered 2018-07-06 – 2018-07-07 (×2): 1 via ORAL
  Filled 2018-07-05 (×2): qty 1

## 2018-07-05 MED ORDER — SIMETHICONE 80 MG PO CHEW
80.0000 mg | CHEWABLE_TABLET | ORAL | Status: DC
  Administered 2018-07-06 (×2): 80 mg via ORAL
  Filled 2018-07-05 (×2): qty 1

## 2018-07-05 MED ORDER — LACTATED RINGERS IV SOLN
INTRAVENOUS | Status: DC
  Administered 2018-07-05 (×3): via INTRAVENOUS

## 2018-07-05 MED ORDER — NALBUPHINE HCL 10 MG/ML IJ SOLN: 5.0000 mg | INTRAMUSCULAR | Status: DC | PRN

## 2018-07-05 MED ORDER — METOCLOPRAMIDE HCL 5 MG/ML IJ SOLN
INTRAMUSCULAR | Status: AC
  Filled 2018-07-05: qty 2

## 2018-07-05 MED ORDER — HYDROMORPHONE HCL 1 MG/ML IJ SOLN: 0.2000 mg | INTRAMUSCULAR | Status: DC | PRN

## 2018-07-05 MED ORDER — PHENYLEPHRINE 8 MG IN D5W 100 ML (0.08MG/ML) PREMIX OPTIME
INJECTION | INTRAVENOUS | Status: DC | PRN
  Administered 2018-07-05: 70 ug/min via INTRAVENOUS

## 2018-07-05 MED ORDER — SENNOSIDES-DOCUSATE SODIUM 8.6-50 MG PO TABS
2.0000 | ORAL_TABLET | ORAL | Status: DC
  Administered 2018-07-06 (×2): 2 via ORAL
  Filled 2018-07-05 (×2): qty 2

## 2018-07-05 MED ORDER — LACTATED RINGERS IV SOLN
INTRAVENOUS | Status: DC
  Administered 2018-07-05: 16:00:00 via INTRAVENOUS

## 2018-07-05 MED ORDER — FENTANYL CITRATE (PF) 100 MCG/2ML IJ SOLN
INTRAMUSCULAR | Status: DC | PRN
  Administered 2018-07-05: 15 ug via INTRAVENOUS

## 2018-07-05 MED ORDER — TETANUS-DIPHTH-ACELL PERTUSSIS 5-2.5-18.5 LF-MCG/0.5 IM SUSP: 0.5000 mL | Freq: Once | INTRAMUSCULAR | Status: DC

## 2018-07-05 MED ORDER — IBUPROFEN 800 MG PO TABS
800.0000 mg | ORAL_TABLET | Freq: Three times a day (TID) | ORAL | Status: DC
  Administered 2018-07-05 – 2018-07-07 (×7): 800 mg via ORAL
  Filled 2018-07-05 (×7): qty 1

## 2018-07-05 MED ORDER — DIPHENHYDRAMINE HCL 50 MG/ML IJ SOLN: 12.5000 mg | INTRAMUSCULAR | Status: DC | PRN

## 2018-07-05 MED ORDER — PHENYLEPHRINE 8 MG IN D5W 100 ML (0.08MG/ML) PREMIX OPTIME
INJECTION | INTRAVENOUS | Status: AC
  Filled 2018-07-05: qty 100

## 2018-07-05 MED ORDER — GENTAMICIN SULFATE 40 MG/ML IJ SOLN
350.0000 mg | INTRAVENOUS | Status: AC
  Administered 2018-07-05: 350 mg via INTRAVENOUS
  Filled 2018-07-05: qty 8.75

## 2018-07-05 MED ORDER — SIMETHICONE 80 MG PO CHEW: 80.0000 mg | CHEWABLE_TABLET | ORAL | Status: DC | PRN

## 2018-07-05 MED ORDER — NALOXONE HCL 0.4 MG/ML IJ SOLN: 0.4000 mg | INTRAMUSCULAR | Status: DC | PRN

## 2018-07-05 MED ORDER — LACTATED RINGERS IV SOLN
INTRAVENOUS | Status: DC | PRN
  Administered 2018-07-05: 08:00:00 via INTRAVENOUS

## 2018-07-05 MED ORDER — DEXAMETHASONE SODIUM PHOSPHATE 4 MG/ML IJ SOLN
INTRAMUSCULAR | Status: DC | PRN
  Administered 2018-07-05: 4 mg via INTRAVENOUS

## 2018-07-05 MED ORDER — OXYTOCIN 10 UNIT/ML IJ SOLN
INTRAMUSCULAR | Status: AC
  Filled 2018-07-05: qty 4

## 2018-07-05 MED ORDER — SODIUM CHLORIDE 0.9 % IR SOLN
Status: DC | PRN
  Administered 2018-07-05: 1000 mL

## 2018-07-05 MED ORDER — POLYETHYLENE GLYCOL 3350 17 G PO PACK
17.0000 g | PACK | Freq: Every day | ORAL | Status: DC
  Administered 2018-07-05 – 2018-07-07 (×3): 17 g via ORAL
  Filled 2018-07-05 (×4): qty 1

## 2018-07-05 MED ORDER — DEXAMETHASONE SODIUM PHOSPHATE 4 MG/ML IJ SOLN
INTRAMUSCULAR | Status: AC
  Filled 2018-07-05: qty 1

## 2018-07-05 MED ORDER — COCONUT OIL OIL: 1.0000 "application " | TOPICAL_OIL | Status: DC | PRN

## 2018-07-05 MED ORDER — SCOPOLAMINE 1 MG/3DAYS TD PT72
1.0000 | MEDICATED_PATCH | Freq: Once | TRANSDERMAL | Status: DC
  Filled 2018-07-05: qty 1

## 2018-07-05 MED ORDER — ZOLPIDEM TARTRATE 5 MG PO TABS: 5.0000 mg | ORAL_TABLET | Freq: Every evening | ORAL | Status: DC | PRN

## 2018-07-05 MED ORDER — SIMETHICONE 80 MG PO CHEW
80.0000 mg | CHEWABLE_TABLET | Freq: Three times a day (TID) | ORAL | Status: DC
  Administered 2018-07-05 – 2018-07-07 (×8): 80 mg via ORAL
  Filled 2018-07-05 (×7): qty 1

## 2018-07-05 MED ORDER — DIBUCAINE 1 % RE OINT: 1.0000 "application " | TOPICAL_OINTMENT | RECTAL | Status: DC | PRN

## 2018-07-05 MED ORDER — DIPHENHYDRAMINE HCL 25 MG PO CAPS: 25.0000 mg | ORAL_CAPSULE | Freq: Four times a day (QID) | ORAL | Status: DC | PRN

## 2018-07-05 MED ORDER — CLINDAMYCIN PHOSPHATE 900 MG/50ML IV SOLN
900.0000 mg | INTRAVENOUS | Status: AC
  Administered 2018-07-05: 900 mg via INTRAVENOUS
  Filled 2018-07-05: qty 50

## 2018-07-05 MED ORDER — SODIUM CHLORIDE 0.9% FLUSH: 3.0000 mL | INTRAVENOUS | Status: DC | PRN

## 2018-07-05 MED ORDER — OXYTOCIN 10 UNIT/ML IJ SOLN
INTRAVENOUS | Status: DC | PRN
  Administered 2018-07-05: 40 [IU] via INTRAVENOUS

## 2018-07-05 MED ORDER — ONDANSETRON HCL 4 MG/2ML IJ SOLN
INTRAMUSCULAR | Status: AC
  Filled 2018-07-05: qty 2

## 2018-07-05 MED ORDER — NALBUPHINE HCL 10 MG/ML IJ SOLN: 5.0000 mg | Freq: Once | INTRAMUSCULAR | Status: DC | PRN

## 2018-07-05 MED ORDER — KETOROLAC TROMETHAMINE 30 MG/ML IJ SOLN: 30.0000 mg | Freq: Four times a day (QID) | INTRAMUSCULAR | Status: AC | PRN

## 2018-07-05 MED ORDER — MORPHINE SULFATE (PF) 0.5 MG/ML IJ SOLN
INTRAMUSCULAR | Status: AC
  Filled 2018-07-05: qty 10

## 2018-07-05 MED ORDER — KETOROLAC TROMETHAMINE 30 MG/ML IJ SOLN
30.0000 mg | Freq: Four times a day (QID) | INTRAMUSCULAR | Status: AC | PRN
  Administered 2018-07-05: 30 mg via INTRAMUSCULAR

## 2018-07-05 MED ORDER — FENTANYL CITRATE (PF) 100 MCG/2ML IJ SOLN
INTRAMUSCULAR | Status: AC
  Filled 2018-07-05: qty 2

## 2018-07-05 MED ORDER — ONDANSETRON HCL 4 MG/2ML IJ SOLN
INTRAMUSCULAR | Status: DC | PRN
  Administered 2018-07-05: 4 mg via INTRAVENOUS

## 2018-07-05 MED ORDER — ACETAMINOPHEN 500 MG PO TABS
1000.0000 mg | ORAL_TABLET | Freq: Four times a day (QID) | ORAL | Status: DC
  Administered 2018-07-05 – 2018-07-07 (×9): 1000 mg via ORAL
  Filled 2018-07-05 (×10): qty 2

## 2018-07-05 MED ORDER — MENTHOL 3 MG MT LOZG: 1.0000 | LOZENGE | OROMUCOSAL | Status: DC | PRN

## 2018-07-05 MED ORDER — OXYTOCIN 40 UNITS IN LACTATED RINGERS INFUSION - SIMPLE MED: 2.5000 [IU]/h | INTRAVENOUS | Status: AC

## 2018-07-05 MED ORDER — NALOXONE HCL 4 MG/10ML IJ SOLN
1.0000 ug/kg/h | INTRAVENOUS | Status: DC | PRN
  Filled 2018-07-05: qty 5

## 2018-07-05 MED ORDER — SOD CITRATE-CITRIC ACID 500-334 MG/5ML PO SOLN
30.0000 mL | ORAL | Status: AC
  Administered 2018-07-05: 30 mL via ORAL
  Filled 2018-07-05: qty 15

## 2018-07-05 MED ORDER — ONDANSETRON HCL 4 MG/2ML IJ SOLN: 4.0000 mg | Freq: Three times a day (TID) | INTRAMUSCULAR | Status: DC | PRN

## 2018-07-05 MED ORDER — WITCH HAZEL-GLYCERIN EX PADS: 1.0000 "application " | MEDICATED_PAD | CUTANEOUS | Status: DC | PRN

## 2018-07-05 MED ORDER — BUPIVACAINE IN DEXTROSE 0.75-8.25 % IT SOLN
INTRATHECAL | Status: DC | PRN
  Administered 2018-07-05: 1.6 mL via INTRATHECAL

## 2018-07-05 MED ORDER — KETOROLAC TROMETHAMINE 30 MG/ML IJ SOLN
INTRAMUSCULAR | Status: AC
  Filled 2018-07-05: qty 1

## 2018-07-05 MED ORDER — MORPHINE SULFATE (PF) 0.5 MG/ML IJ SOLN
INTRAMUSCULAR | Status: DC | PRN
  Administered 2018-07-05: .15 mg via EPIDURAL

## 2018-07-05 MED ORDER — DIPHENHYDRAMINE HCL 25 MG PO CAPS: 25.0000 mg | ORAL_CAPSULE | ORAL | Status: DC | PRN

## 2018-07-05 MED ORDER — SERTRALINE HCL 50 MG PO TABS
75.0000 mg | ORAL_TABLET | Freq: Every day | ORAL | Status: DC
  Administered 2018-07-06 – 2018-07-07 (×2): 75 mg via ORAL
  Filled 2018-07-05 (×3): qty 1

## 2018-07-05 MED ORDER — OXYCODONE HCL 5 MG PO TABS
5.0000 mg | ORAL_TABLET | ORAL | Status: DC | PRN
  Administered 2018-07-06 – 2018-07-07 (×4): 5 mg via ORAL
  Administered 2018-07-07 (×2): 10 mg via ORAL
  Filled 2018-07-05: qty 2
  Filled 2018-07-05: qty 1
  Filled 2018-07-05: qty 2
  Filled 2018-07-05 (×3): qty 1

## 2018-07-05 MED ORDER — ALBUTEROL SULFATE (2.5 MG/3ML) 0.083% IN NEBU: 3.0000 mL | INHALATION_SOLUTION | Freq: Four times a day (QID) | RESPIRATORY_TRACT | Status: DC | PRN

## 2018-07-05 MED ORDER — PNEUMOCOCCAL VAC POLYVALENT 25 MCG/0.5ML IJ INJ
0.5000 mL | INJECTION | INTRAMUSCULAR | Status: DC
  Filled 2018-07-05: qty 0.5

## 2018-07-05 SURGICAL SUPPLY — 39 items
ADH SKN CLS APL DERMABOND .7 (GAUZE/BANDAGES/DRESSINGS) ×1
APL SKNCLS STERI-STRIP NONHPOA (GAUZE/BANDAGES/DRESSINGS) ×1
BENZOIN TINCTURE PRP APPL 2/3 (GAUZE/BANDAGES/DRESSINGS) ×1 IMPLANT
CHLORAPREP W/TINT 26ML (MISCELLANEOUS) ×2 IMPLANT
CLAMP CORD UMBIL (MISCELLANEOUS) IMPLANT
CLOSURE STERI STRIP 1/2 X4 (GAUZE/BANDAGES/DRESSINGS) ×1 IMPLANT
CLOTH BEACON ORANGE TIMEOUT ST (SAFETY) ×2 IMPLANT
DERMABOND ADVANCED (GAUZE/BANDAGES/DRESSINGS) ×1
DERMABOND ADVANCED .7 DNX12 (GAUZE/BANDAGES/DRESSINGS) ×1 IMPLANT
DRSG OPSITE POSTOP 4X10 (GAUZE/BANDAGES/DRESSINGS) ×2 IMPLANT
ELECT REM PT RETURN 9FT ADLT (ELECTROSURGICAL) ×2
ELECTRODE REM PT RTRN 9FT ADLT (ELECTROSURGICAL) ×1 IMPLANT
EXTRACTOR VACUUM KIWI (MISCELLANEOUS) IMPLANT
GLOVE BIO SURGEON STRL SZ 6.5 (GLOVE) ×2 IMPLANT
GLOVE BIOGEL PI IND STRL 6.5 (GLOVE) ×1 IMPLANT
GLOVE BIOGEL PI IND STRL 7.0 (GLOVE) ×2 IMPLANT
GLOVE BIOGEL PI INDICATOR 6.5 (GLOVE) ×1
GLOVE BIOGEL PI INDICATOR 7.0 (GLOVE) ×2
GOWN STRL REUS W/TWL LRG LVL3 (GOWN DISPOSABLE) ×4 IMPLANT
KIT ABG SYR 3ML LUER SLIP (SYRINGE) ×2 IMPLANT
NDL HYPO 25X5/8 SAFETYGLIDE (NEEDLE) ×1 IMPLANT
NEEDLE HYPO 25X5/8 SAFETYGLIDE (NEEDLE) ×2 IMPLANT
NS IRRIG 1000ML POUR BTL (IV SOLUTION) ×2 IMPLANT
PACK C SECTION WH (CUSTOM PROCEDURE TRAY) ×2 IMPLANT
PAD OB MATERNITY 4.3X12.25 (PERSONAL CARE ITEMS) ×2 IMPLANT
PENCIL SMOKE EVAC W/HOLSTER (ELECTROSURGICAL) ×2 IMPLANT
RETRACTOR WND ALEXIS 25 LRG (MISCELLANEOUS) IMPLANT
RTRCTR WOUND ALEXIS 25CM LRG (MISCELLANEOUS)
SPONGE LAP 18X18 RF (DISPOSABLE) ×6 IMPLANT
SUT PLAIN 0 NONE (SUTURE) IMPLANT
SUT PLAIN 2 0 (SUTURE) ×2
SUT PLAIN ABS 2-0 CT1 27XMFL (SUTURE) ×1 IMPLANT
SUT VIC AB 0 CT1 36 (SUTURE) ×3 IMPLANT
SUT VIC AB 0 CTX 36 (SUTURE) ×4
SUT VIC AB 0 CTX36XBRD ANBCTRL (SUTURE) ×2 IMPLANT
SUT VIC AB 4-0 PS2 18 (SUTURE) ×1 IMPLANT
SUT VIC AB 4-0 PS2 27 (SUTURE) ×2 IMPLANT
TOWEL OR 17X24 6PK STRL BLUE (TOWEL DISPOSABLE) ×2 IMPLANT
TRAY FOLEY W/BAG SLVR 14FR LF (SET/KITS/TRAYS/PACK) IMPLANT

## 2018-07-05 NOTE — Lactation Note (Signed)
This note was copied from a baby's chart. Lactation Consultation Note  Patient Name: Angel Angel [name]: 07/05/2018 Reason for consult: Initial assessment;1st time breastfeeding;Term P3, 13 hour female infant. Per mom, she really  wants to breastfeed and feels breast feeding  is  going well. Per mom,  she was unable breastfeed with her twins who were in NICU she could never get them to latch.  LC entered room infant was cuing and mom was changing a void and stool (clay) diaper. Per parents, infant had total of 3 voids and 3 stools since delivery. Per parents, they have been doing STS and mom was doing STS when [facility]. Mom taught back hand expression and infant was given 4ml of colostrum by spoon. Mom has good milk volume. Infant had a little emesis (moucous ) after receiving colostrum. LC burped baby and infant started cuing again to breastfeed. Mom latched infant to left breast using cross cradle hold, infant latched with wide mouth gape, swallows observed and infant breastfeed for 13 minutes. Latch score 9.  Infant appeared satiety after breast feeding. Mom will breastfeed according hunger cues, 8 to 12 times and will not exceed 3 hours without breastfeeding infant. LC discussed I & O. Reviewed Baby & Me book's Breastfeeding Basics.  Mom made aware of O/P services, breastfeeding support groups, community resources, and our phone # for post-discharge questions.  BF plans: 1. Mom will breastfeed according hunger cues and not exceed 3 hours without breastfeeding. 2.Parents will do as much STS as possible. 3. Mom will call Nurse or LC if she has any further questions, concerns or need assistance with breastfeeding.  Maternal Data Formula Feeding for Exclusion: No Has patient been taught Hand Expression?: Yes(Mom demostrated hand expression and infant given 4 ml of colostrum.) Does the patient have breastfeeding experience prior to this delivery?: No  Feeding Feeding  Type: Breast Fed  LATCH Score Latch: Grasps breast easily, tongue down, lips flanged, rhythmical sucking.  Audible Swallowing: Spontaneous and intermittent  Type of Nipple: Everted at rest and after stimulation  Comfort (Breast/Nipple): Soft / non-tender  Hold (Positioning): Assistance needed to correctly position infant at breast and maintain latch.  LATCH Score: 9  Interventions Interventions: Breast feeding basics reviewed;Assisted with latch;Skin to skin;Breast massage;Hand express;Support pillows;Adjust position;Breast compression;Position options;Expressed milk  Lactation Tools Discussed/Used     Consult Status Consult Status: Follow-up Angel [name]: 07/06/18 Follow-up type: In-patient    Angel EarthlyRobin Adrin [name] 07/05/2018, 9:21 PM

## 2018-07-05 NOTE — Progress Notes (Signed)
MOB was referred for history of depression/anxiety. * Referral screened out by Clinical Social Worker because none of the following criteria appear to apply: ~ History of anxiety/depression during this pregnancy, or of post-partum depression following prior delivery. ~ Diagnosis of anxiety and/or depression within last 3 years OR * MOB's symptoms currently being treated with medication and/or therapy. MOB has an active Rx for Zoloft.   Please contact the Clinical Social Worker if needs arise, by MOB request, or if MOB scores greater than 9/yes to question 10 on [place] Postpartum Depression Screen.  Angel [name], MSW, LCSW Clinical Social Work [phone]  

## 2018-07-05 NOTE — Transfer of Care (Signed)
Immediate Anesthesia Transfer of Care Note  Patient: Angel [name]  Procedure(s) Performed: REPEAT CESAREAN SECTION WITH BILATERAL TUBAL LIGATION (N/A )  Patient Location: PACU  Anesthesia Type:Spinal  Level of Consciousness: awake and patient cooperative  Airway & Oxygen Therapy: Patient Spontanous Breathing  Post-op Assessment: Report given to RN and Post -op Vital signs reviewed and stable  Post vital signs: Reviewed and stable  Last Vitals:  Vitals Value Taken Time  BP    Temp    Pulse    Resp    SpO2      Last Pain:  Vitals:   07/05/18 0604  TempSrc: Oral         Complications: No apparent anesthesia complications

## 2018-07-05 NOTE — Brief Op Note (Signed)
07/05/2018  9:08 AM  PATIENT:  Adron Beneaylor M Mason  27 y.o. female  PRE-OPERATIVE DIAGNOSIS:  previous X, desires sterility  POST-OPERATIVE DIAGNOSIS:  previous X, desires sterility  PROCEDURE:  Procedure(s) with comments: REPEAT CESAREAN SECTION WITH BILATERAL TUBAL LIGATION (N/A) - Repeat edc 07/12/18 allergy to cefzil, vancomycin, augmentin Tracey RNFA  SURGEON:  Surgeon(s) and Role:    * Niesha Bame, Madelaine EtienneElise Jennifer, MD - Primary  PHYSICIAN ASSISTANT:   ASSISTANTS: Tracey RFNA   ANESTHESIA:   spinal  EBL:  224 mL   BLOOD ADMINISTERED:none  DRAINS: Urinary Catheter (Foley)   LOCAL MEDICATIONS USED:  NONE  SPECIMEN:  Source of Specimen:  placenta  DISPOSITION OF SPECIMEN:  routine  COUNTS:  YES  TOURNIQUET:  * No tourniquets in log *  DICTATION: .Note written in EPIC  PLAN OF CARE: Admit to inpatient   PATIENT DISPOSITION:  PACU - hemodynamically stable.   Delay start of Pharmacological VTE agent (>24hrs) due to surgical blood loss or risk of bleeding: not applicable

## 2018-07-05 NOTE — Anesthesia Procedure Notes (Signed)
Spinal  Patient location during procedure: ICU Start time: 07/05/2018 7:20 AM End time: 07/05/2018 7:23 AM Staffing Anesthesiologist: Phillips Groutarignan, Peter, MD Other anesthesia staff: Bethanne GingerLafuria, Keeana Pieratt D, RN Performed: other anesthesia staff  Preanesthetic Checklist Completed: patient identified, site marked, surgical consent, pre-op evaluation, timeout performed, IV checked, risks and benefits discussed and monitors and equipment checked Spinal Block Patient position: sitting Prep: ChloraPrep Patient monitoring: heart rate and continuous pulse ox Approach: midline Location: L4-5 Injection technique: single-shot Needle Needle type: Pencan  Needle gauge: 25 G Assessment Sensory level: T4

## 2018-07-05 NOTE — Op Note (Signed)
PROCEDURE DATE: 07/05/18  PREOPERATIVE DIAGNOSIS: Intrauterine pregnancy at 39.0 wga, Indication: repeat and bilateral tubal ligation  POSTOPERATIVE DIAGNOSIS:The same  PROCEDURE: repeat Low TransverseCesarean Section with bilateral tubal ligation  SURGEON: Dr. Belva AgeeElise Gerarda Conklin  INDICATIONS:This is a 89EA [id number] G2P1103 @ 39.0 wga requiring cesarean section secondary to desires repeat and permanent steriliy. The risks of cesarean section discussed with the patient included but were not limited to: bleeding which may require transfusion or reoperation; infection which may require antibiotics; injury to bowel, bladder, ureters or other surrounding organs; injury to the fetus; need for additional procedures including hysterectomy in the event of a life-threatening hemorrhage; placental abnormalities wth subsequent pregnancies, incisional problems, thromboembolic phenomenon and other postoperative/anesthesia complications.  We specifically reviewed risks of a BTL - risk of regret being high given her age and she understands. We also addressed the risk of failure and ectopic pregnancy. The patient agreed with the proposed plan, giving informed consent for the procedure.   FINDINGS: Viable femaleinfant in vertex presentation,APGARs pending, Weight pending, Amniotic fluid  clear, Intact placenta, three vessel cord. Grossly normal uterus, ovaries and fallopian tubes. .  ANESTHESIA: Epidural ESTIMATED BLOOD LOSS: 224cc SPECIMENS: Placenta for routine COMPLICATIONS: None immediate  PROCEDURE IN DETAIL: The patient received intravenous antibiotics (2g Ancef) and had sequential compression devices applied to her lower extremities while in the preoperative area. Shewasthen taken to the operating roomwhere epidural anesthesiawas dosed up to surgical level andwas found to be adequate. She was then placed in a dorsal supine position with a leftward tilt,and prepped and draped in a sterile  manner.A foley catheter was placed into her bladder and attached to constant gravity. After an adequate timeout was performed, aPfannenstiel skin incision was made with scalpel and carried through to the underlying layer of fascia. The fascia was incised in the midline and this incision was extended bilaterally using the Mayo scissors. Kocher clamps were applied to the superior aspect of the fascial incision and the underlying rectus muscles were dissected off bluntly. A similar process was carried out on the inferior aspect of the facial incision. The rectus muscles were separated in the midline bluntly and the peritoneum was entered bluntly. A bladder flap was created sharply and developed bluntly.Atransverse hysterotomy was made with a scalpel and extended bilaterally bluntly. The bladder blade was then removed. The infant was successfully delivered, and cord was clamped and cut and infant was handed over to awaiting neonatology team. Uterine massage was then administered and the placenta delivered intact with three-vessel cord. Cord gases were taken. The uterus was cleared of clot and debris. The hysterotomy was closed with 0 vicryl.A second imbricating suture of 0-vicryl was used to reinforce the incision and aid in hemostasis. A modified Pomeroy bilateral tubal ligation was performed in the usual fashion and good hemostasis achieved.The fascia was closed with 0-Vicryl in a running fashion with good restoration of anatomy. The subcutaneus tissue was irrigated and was reapproximated using three interrupted plain gut stitches. The skin was closed with 4-0 Vicryl in a subcuticular fashion.  Final EBL was 224cc (all surgical site and was hemostatic at end of procedure) without any further bleeding on exam.   Pt tolerated the procedure well. All sponge/lap/needle counts were correct X 2. Pt taken to recovery room in stable condition.  It's a girl - "Angel [name]"!   Belva AgeeElise Lezli Danek MD

## 2018-07-05 NOTE — Anesthesia Preprocedure Evaluation (Signed)
Anesthesia Evaluation  Patient identified by MRN, date of birth, ID band Patient awake    Reviewed: Allergy & Precautions, NPO status , Patient's Chart, lab work & pertinent test results  Airway Mallampati: II  TM Distance: >3 FB Neck ROM: Full    Dental no notable dental hx.    Pulmonary asthma , former smoker,    Pulmonary exam normal breath sounds clear to auscultation       Cardiovascular negative cardio ROS Normal cardiovascular exam Rhythm:Regular Rate:Normal     Neuro/Psych negative neurological ROS  negative psych ROS   GI/Hepatic negative GI ROS, Neg liver ROS,   Endo/Other  negative endocrine ROS  Renal/GU negative Renal ROS  negative genitourinary   Musculoskeletal negative musculoskeletal ROS (+)   Abdominal   Peds negative pediatric ROS (+)  Hematology negative hematology ROS (+)   Anesthesia Other Findings   Reproductive/Obstetrics (+) Pregnancy                             Anesthesia Physical Anesthesia Plan  ASA: II  Anesthesia Plan: Spinal   Post-op Pain Management:    Induction:   PONV Risk Score and Plan: 2 and Ondansetron, Treatment may vary due to age or medical condition and Scopolamine patch - Pre-op  Airway Management Planned: Simple Face Mask  Additional Equipment:   Intra-op Plan:   Post-operative Plan:   Informed Consent: I have reviewed the patients History and Physical, chart, labs and discussed the procedure including the risks, benefits and alternatives for the proposed anesthesia with the patient or authorized representative who has indicated his/her understanding and acceptance.   Dental advisory given  Plan Discussed with: CRNA  Anesthesia Plan Comments:         Anesthesia Quick Evaluation

## 2018-07-06 LAB — CBC
HCT: 33.2 % — ABNORMAL LOW (ref 36.0–46.0)
Hemoglobin: 10.4 g/dL — ABNORMAL LOW (ref 12.0–15.0)
MCH: 24.9 pg — AB (ref 26.0–34.0)
MCHC: 31.3 g/dL (ref 30.0–36.0)
MCV: 79.6 fL — ABNORMAL LOW (ref 80.0–100.0)
Platelets: 179 10*3/uL (ref 150–400)
RBC: 4.17 MIL/uL (ref 3.87–5.11)
RDW: 15.1 % (ref 11.5–15.5)
WBC: 9.4 10*3/uL (ref 4.0–10.5)
nRBC: 0 % (ref 0.0–0.2)

## 2018-07-06 LAB — BIRTH TISSUE RECOVERY COLLECTION (PLACENTA DONATION)

## 2018-07-06 NOTE — Lactation Note (Signed)
This note was copied from a baby's chart. Lactation Consultation Note  Patient Name: Angel [name] VEHMC'N Date: 07/06/2018 Reason for consult: Follow-up assessment;Term Baby is 66 hours old  6 % weight loss , LC reviewed and updated the doc flow sheets per mom.  Baby awake and rooting, LC assisted mom with  The cross cradle position On the left breast / baby opened wide and latched with depth easily and breast  Compressions. Increased swallows noted with breast compressions and per mom  Comfortable. Nipple well rounded when baby released after 15 mins.  Per mom 1st babies ( twins ) and feel this breast feeding is all new to me because  By the time I was able to Breast feed the twins they had so many bottles breastfeeding  Was difficult.  LC reviewed breast feeding basics , importance of STS  feedings until the baby  Is back to birth weight / gaining steadily/ and can stay awake for majority of feeding.  Prior to latch on the 1st breast - breast massage , hand express, latch with firm support  And breast compressions until baby is in a consistent feeding pattern with swallows, then  Intermittent.  Mom and dad receptive to review and expressed their appreciation for information.    Maternal Data Has patient been taught Hand Expression?: Yes  Feeding Feeding Type: Breast Fed  LATCH Score Latch: Grasps breast easily, tongue down, lips flanged, rhythmical sucking.  Audible Swallowing: Spontaneous and intermittent  Type of Nipple: Everted at rest and after stimulation  Comfort (Breast/Nipple): Soft / non-tender  Hold (Positioning): Assistance needed to correctly position infant at breast and maintain latch.  LATCH Score: 9  Interventions Interventions: Breast feeding basics reviewed;Assisted with latch;Skin to skin;Hand express;Breast compression;Adjust position;Support pillows;Position options  Lactation Tools Discussed/Used     Consult Status Consult Status:  Follow-up Date: 07/07/18 Follow-up type: In-patient    Matilde Sprang Lolitha Tortora 07/06/2018, 3:59 PM

## 2018-07-06 NOTE — Progress Notes (Signed)
Subjective: Postpartum Day 1: Repeat Cesarean Delivery with BTL Patient reports incisional pain, tolerating PO and no problems voiding.    Objective: Vital signs in last 24 hours: Temp:  [97 F (36.1 C)-99.5 F (37.5 C)] 98 F (36.7 C) (01/01 0615) Pulse Rate:  [50-84] 84 (01/01 0615) Resp:  [11-22] 18 (01/01 0615) BP: (96-118)/(59-81) 113/71 (01/01 0615) SpO2:  [97 %-100 %] 99 % (01/01 0615)  Physical Exam:  General: alert, cooperative and appears stated age 28: appropriate Uterine Fundus: firm Incision: healing well, no significant drainage, no dehiscence, no significant erythema DVT Evaluation: No evidence of DVT seen on physical exam. Negative Homan's sign. No cords or calf tenderness. No significant calf/ankle edema.  Recent Labs    07/04/18 0910 07/06/18 0638  HGB 11.6* 10.4*  HCT 36.8 33.2*    Assessment/Plan: Status post Cesarean section with BTL. Doing well postoperatively.  Continue current care. Keep pain meds scheduled.    Madelaine Etienne Leger 07/06/2018, 8:40 AM

## 2018-07-07 ENCOUNTER — Encounter (HOSPITAL_COMMUNITY): Payer: Self-pay | Admitting: Obstetrics and Gynecology

## 2018-07-07 MED ORDER — ACETAMINOPHEN 500 MG PO TABS: 1000.0000 mg | ORAL_TABLET | Freq: Four times a day (QID) | ORAL | 0 refills | Status: DC | PRN

## 2018-07-07 MED ORDER — OXYCODONE HCL 5 MG PO TABS: 5.0000 mg | ORAL_TABLET | ORAL | 0 refills | Status: DC | PRN

## 2018-07-07 MED ORDER — IBUPROFEN 600 MG PO TABS: 600.0000 mg | ORAL_TABLET | Freq: Four times a day (QID) | ORAL | 0 refills | Status: DC | PRN

## 2018-07-07 MED ORDER — HYDROCORTISONE 1 % EX CREA
TOPICAL_CREAM | Freq: Two times a day (BID) | CUTANEOUS | Status: DC | PRN
  Filled 2018-07-07: qty 28

## 2018-07-07 MED ORDER — SERTRALINE HCL 50 MG PO TABS: 75.0000 mg | ORAL_TABLET | Freq: Every day | ORAL | 3 refills | Status: DC

## 2018-07-07 NOTE — Discharge Summary (Signed)
Obstetric Discharge Summary Reason for Admission: cesarean section Prenatal Procedures: none Intrapartum Procedures: cesarean: low cervical, transverse and tubal ligation Postpartum Procedures: none Complications-Operative and Postpartum: none Hemoglobin  Date Value Ref Range Status  07/06/2018 10.4 (L) 12.0 - 15.0 g/dL Final   HCT  Date Value Ref Range Status  07/06/2018 33.2 (L) 36.0 - 46.0 % Final    Physical Exam:  General: alert, cooperative and no distress Lochia: appropriate Uterine Fundus: firm Incision: healing well DVT Evaluation: No evidence of DVT seen on physical exam.  Discharge Diagnoses: Term Pregnancy-delivered  Discharge Information: Date: 07/07/2018 Activity: pelvic rest Diet: routine Medications: PNV, Ibuprofen and tylenol, oxycodone Condition: stable Instructions: refer to practice specific booklet Discharge to: home   Newborn Data: Live born female  Birth Weight: 8 lb 5.2 oz (3775 g) APGAR: 9, 9  Newborn Delivery   Birth date/time:  [birth year]/2018 07:45:00 Delivery type:  C-Section, Low Transverse Trial of labor:  No C-section categorization:  Repeat     Home with mother.  Roselle Locus II 07/07/2018, 8:20 AM

## 2018-07-07 NOTE — Lactation Note (Signed)
This note was copied from a baby's chart. Lactation Consultation Note  Patient Name: Angel [name] [id number] Date: 07/07/2018 Reason for consult: Follow-up assessment;Infant weight loss;Term  NP requested LC to see this patient again due to weight loss of 10%. Mom's nipples are sore, noticed some redness but no other signs of trauma observed. Offered assistance with latch when entering the room but mom politely declined stating that baby already fed, she's on Similac 20 calorie formula in addition to being supplemented with EBM; baby was asleep. Mom started pumping today and feeding baby her EBM in a bottle with a slow flow nipple. Mom has been working with RN Pierina on BF, but baby is still having a hard time latching on. Asked mom to call for assistance when needed.  When revising hand expression with mom, she was able to get colostrum very easily, but noted that her tissue is challenging, semi-compressible with some edema noted. LC set mom up with breast shells, instructions, cleaning and storage were reviewed. Mom's boyfriend will be bringing her a bra tomorrow to start wearing her shells.  Feeding plan:  1. Encouraged mom to feed baby at least 8-12 times/24 hours or sooner if feeding cues are present 2. Mom will supplement baby after feedings, using her EBM first and then Similac 20 to complete the volumes required for supplementation according to baby's age. Mom aware baby needs to be supplemented and fed on demand. 3. She'll start wearing her breast shells tomorrow.  Mom reported all questions and concerns were answered, she's aware of LC services and will call PRN.  Maternal Data    Feeding Feeding Type: Formula   Interventions Interventions: Breast feeding basics reviewed;Breast massage;Breast compression;Reverse pressure;Shells;Hand express;DEBP  Lactation Tools Discussed/Used Tools: Shells Shell Type: Inverted Pump Review: Setup, frequency, and cleaning Initiated by:: RN  and MPeck (adjusted junctures in pump, they're loose) Date initiated:: 07/07/18   Consult Status Consult Status: Follow-up Date: 07/08/18 Follow-up type: In-patient    Angel [name] 07/07/2018, 5:57 PM

## 2018-07-07 NOTE — Anesthesia Postprocedure Evaluation (Signed)
Anesthesia Post Note  Patient: Angel [name]  Procedure(s) Performed: REPEAT CESAREAN SECTION WITH BILATERAL TUBAL LIGATION (N/A )     Patient location during evaluation: PACU Anesthesia Type: Spinal Level of consciousness: awake and alert Pain management: pain level controlled Vital Signs Assessment: post-procedure vital signs reviewed and stable Respiratory status: spontaneous breathing and respiratory function stable Cardiovascular status: blood pressure returned to baseline and stable Postop Assessment: no headache, no backache, spinal receding and no apparent nausea or vomiting Anesthetic complications: no    Last Vitals:  Vitals:   07/06/18 2132 07/07/18 0552  BP: 113/73 102/67  Pulse: 79 63  Resp: 14 16  Temp: 36.9 C 36.9 C  SpO2: 98% 99%    Last Pain:  Vitals:   07/07/18 1759  TempSrc:   PainSc: 7    Pain Goal:                 Phillips Grout

## 2018-07-07 NOTE — Lactation Note (Addendum)
This note was copied from a baby's chart. Lactation Consultation Note  Patient Name: Angel [name] NGEXB'MToday's Date: 07/07/2018 Reason for consult: Follow-up assessment   Baby 49 hours old and latched upon entering with intermittent swallows. 9.7%. Observed 30 minutes of feeding.  4 voids/3 stools.  Green stools.  With hand expression mother has excellent flow of colostrum with squirting. Reviewed manual pump and giving back extra volume and milk storage. Discussed frequency of breastfeeding.  Feed on demand approximately 8-12 times per day.   Wake baby for feedings if she goes too long for feedings.  Reviewed engorgement care and monitoring voids/stools.    Maternal Data    Feeding Feeding Type: Breast Fed  LATCH Score Latch: Grasps breast easily, tongue down, lips flanged, rhythmical sucking.  Audible Swallowing: Spontaneous and intermittent  Type of Nipple: Everted at rest and after stimulation  Comfort (Breast/Nipple): Soft / non-tender  Hold (Positioning): Assistance needed to correctly position infant at breast and maintain latch.  LATCH Score: 9  Interventions Interventions: Breast feeding basics reviewed;Assisted with latch;Hand express;Pre-pump if needed;Hand pump;Adjust position;Support pillows;Breast compression  Lactation Tools Discussed/Used     Consult Status Consult Status: Follow-up Date: 07/08/18 Follow-up type: In-patient    Dahlia ByesBerkelhammer, Dorlisa Savino [facility] 07/07/2018, 9:04 AM

## 2018-07-08 ENCOUNTER — Ambulatory Visit: Payer: Self-pay

## 2018-07-08 NOTE — Lactation Note (Signed)
This note was copied from a baby's chart. Lactation Consultation Note  Patient Name: Angel [name] HENID'P Date: 07/08/2018 Reason for consult: Follow-up assessment;Infant weight loss;Other (Comment)(weight gain since yesterday, milk is in / encouraged mom to call with feeding cues and for now pump both breast 10 mins )  Baby  Is 73 hours old  As LC entered the room , baby asleep in the crib , dad on the couch asleep, and mom waking up.  Per mom baby still having a difficult time latching, and my nipples are sore, and breast are full.  Mom requested for LC to check her breast. LC noted no nipple breakdown, just semi compressible  Full areolas and nodules on the lateral aspects of both breast, no engorgement , just full.  LC recommended since baby recently fed at 7 am , to go ahead and pump both breast for 10 mins  To release to tightness.  Per mom my goal is to re-latch my baby.  LC encouraged mom to call when baby is showing feeding cues for this LC.  DEBP is set up in the room .    Maternal Data Has patient been taught Hand Expression?: Yes  Feeding Feeding Type: (per dad baby last fed at 7 am )  LATCH Score                   Interventions Interventions: Breast feeding basics reviewed  Lactation Tools Discussed/Used     Consult Status Consult Status: Follow-up Date: 07/08/18 Follow-up type: In-patient    Angel [name] 07/08/2018, 8:57 AM

## 2018-08-29 ENCOUNTER — Ambulatory Visit: Payer: Self-pay | Admitting: Surgery

## 2018-09-13 NOTE — Patient Instructions (Signed)
KEILAN KONYA  09/13/2018   Your procedure is scheduled on: 09-22-18  Report to [facility] Main  Entrance             Report to   Short Stay at           0530 AM    Call this number if you have problems the morning of surgery [phone]    Remember: Do not eat food or drink liquids :After Midnight. BRUSH YOUR TEETH MORNING OF SURGERY AND RINSE YOUR MOUTH OUT, NO CHEWING GUM CANDY OR MINTS.     Take these medicines the morning of surgery with A SIP OF WATER: lexapro, inhalers and bring them with you                                You may not have any metal on your body including hair pins and              piercings  Do not wear jewelry, make-up, lotions, powders or perfumes, deodorant             Do not wear nail polish.  Do not shave  48 hours prior to surgery.                 Do not bring valuables to the hospital. [facility] IS NOT             RESPONSIBLE   FOR VALUABLES.  Contacts, dentures or bridgework may not be worn into surgery.  Leave suitcase in the car. After surgery it may be brought to your room.     Patients discharged the day of surgery will not be allowed to drive home. IF YOU ARE HAVING SURGERY AND GOING HOME THE SAME DAY, YOU MUST HAVE AN ADULT TO DRIVE YOU HOME AND BE WITH YOU FOR 24 HOURS. YOU MAY GO HOME BY TAXI OR UBER OR ORTHERWISE, BUT AN ADULT MUST ACCOMPANY YOU HOME AND STAY WITH YOU FOR 24 HOURS.  Name and phone number of your driver:  Special Instructions: N/A              Please read over the following fact sheets you were given: _____________________________________________________________________             [facility] - Preparing for Surgery Before surgery, you can play an important role.  Because skin is not sterile, your skin needs to be as free of germs as possible.  You can reduce the number of germs on your skin by washing with CHG (chlorahexidine gluconate) soap before surgery.  CHG is an antiseptic cleaner  which kills germs and bonds with the skin to continue killing germs even after washing. Please DO NOT use if you have an allergy to CHG or antibacterial soaps.  If your skin becomes reddened/irritated stop using the CHG and inform your nurse when you arrive at Short Stay. Do not shave (including legs and underarms) for at least 48 hours prior to the first CHG shower.  You may shave your face/neck. Please follow these instructions carefully:  1.  Shower with CHG Soap the night before surgery and the  morning of Surgery.  2.  If you choose to wash your hair, wash your hair first as usual with your  normal  shampoo.  3.  After you shampoo, rinse your  hair and body thoroughly to remove the  shampoo.                           4.  Use CHG as you would any other liquid soap.  You can apply chg directly  to the skin and wash                       Gently with a scrungie or clean washcloth.  5.  Apply the CHG Soap to your body ONLY FROM THE NECK DOWN.   Do not use on face/ open                           Wound or open sores. Avoid contact with eyes, ears mouth and genitals (private parts).                       Wash face,  Genitals (private parts) with your normal soap.             6.  Wash thoroughly, paying special attention to the area where your surgery  will be performed.  7.  Thoroughly rinse your body with warm water from the neck down.  8.  DO NOT shower/wash with your normal soap after using and rinsing off  the CHG Soap.                9.  Pat yourself dry with a clean towel.            10.  Wear clean pajamas.            11.  Place clean sheets on your bed the night of your first shower and do not  sleep with pets. Day of Surgery : Do not apply any lotions/deodorants the morning of surgery.  Please wear clean clothes to the hospital/surgery center.  FAILURE TO FOLLOW THESE INSTRUCTIONS MAY RESULT IN THE CANCELLATION OF YOUR SURGERY PATIENT SIGNATURE_________________________________  NURSE  SIGNATURE__________________________________  ________________________________________________________________________

## 2018-09-14 ENCOUNTER — Encounter (HOSPITAL_COMMUNITY)
Admission: RE | Admit: 2018-09-14 | Discharge: 2018-09-14 | Disposition: A | Discharge status: Home / Self Care | Payer: Medicaid Other | Source: Ambulatory Visit | Attending: Surgery | Admitting: Surgery

## 2018-09-14 ENCOUNTER — Other Ambulatory Visit: Payer: Self-pay

## 2018-09-14 ENCOUNTER — Encounter (HOSPITAL_COMMUNITY): Payer: Self-pay

## 2018-09-14 DIAGNOSIS — Z01812 Encounter for preprocedural laboratory examination: Secondary | ICD-10-CM | POA: Insufficient documentation

## 2018-09-14 DIAGNOSIS — K439 Ventral hernia without obstruction or gangrene: Secondary | ICD-10-CM | POA: Insufficient documentation

## 2018-09-14 HISTORY — DX: Anxiety disorder, unspecified: F41.9

## 2018-09-14 LAB — CBC
HCT: 43.4 % (ref 36.0–46.0)
Hemoglobin: 12.9 g/dL (ref 12.0–15.0)
MCH: 25.4 pg — AB (ref 26.0–34.0)
MCHC: 29.7 g/dL — AB (ref 30.0–36.0)
MCV: 85.6 fL (ref 80.0–100.0)
Platelets: 245 10*3/uL (ref 150–400)
RBC: 5.07 MIL/uL (ref 3.87–5.11)
RDW: 17.4 % — ABNORMAL HIGH (ref 11.5–15.5)
WBC: 7.6 10*3/uL (ref 4.0–10.5)
nRBC: 0 % (ref 0.0–0.2)

## 2018-09-14 LAB — HCG, SERUM, QUALITATIVE: PREG SERUM: NEGATIVE

## 2018-09-15 ENCOUNTER — Encounter (HOSPITAL_COMMUNITY): Payer: Self-pay | Admitting: Physician Assistant

## 2018-09-18 ENCOUNTER — Encounter (HOSPITAL_COMMUNITY): Payer: Self-pay | Admitting: Surgery

## 2018-09-18 DIAGNOSIS — K439 Ventral hernia without obstruction or gangrene: Secondary | ICD-10-CM | POA: Diagnosis present

## 2018-09-18 NOTE — H&P (Signed)
General Surgery [facility]  Angel [name] DOB: 1991-01-24 Single / Language: [place] / Race: White Female   History of Present Illness  The patient is a 28 year old female who presents with an abdominal wall hernia.  CHIEF COMPLAINT: ventral hernia  Patient returns to my practice having had a history of umbilical hernia repair with mesh patch in 2017. Subsequently she again became pregnant and during pregnancy, approximately 2 months in, she noted a bulge to the right and above the level of the umbilicus. This has slowly become larger. It has been at least partially reducible. It does cause her some discomfort. She presents today to discuss operative repair. She has had no signs or symptoms of intestinal obstruction. She does do some lifting as her newborn weighs approximately 9 pounds now.   Problem List/Past Medical  VISIT FOR WOUND CHECK (Z51.89)  UMBILICAL HERNIA WITHOUT OBSTRUCTION OR GANGRENE (K42.9)  VENTRAL HERNIA WITHOUT OBSTRUCTION OR GANGRENE (K43.9)   Diagnostic Studies History Pap Smear  1-5 years ago  Allergies Cefprozil *CEPHALOSPORINS*  Cefzil *CEPHALOSPORINS*  Vancomycin HCl *ANTI-INFECTIVE AGENTS - MISC.*  Allergies Reconciled   Medication History Prenatal Fe-90 (Oral) Active. Ibuprofen (600MG  Tablet, Oral) Active. Tylenol (325MG  Tablet, Oral as needed) Active. Medications Reconciled  Family History  Diabetes Mellitus  Father.  Other Problems Anxiety Disorder  Asthma  Back Pain  Depression  Other disease, cancer, significant illness   Vitals Weight: 203.13 lb Height: 65in Body Surface Area: 1.99 m Body Mass Index: 33.8 kg/m  Temp.: 97.73F(Oral)  Pulse: 93 (Regular)  BP: 112/58 (Sitting, Left Arm, Standard)  Physical Exam  See vital signs recorded above  GENERAL APPEARANCE Development: normal Nutritional status: normal Gross deformities: none  SKIN Rash, lesions, ulcers:  none Induration, erythema: none Nodules: none palpable  EYES Conjunctiva and lids: normal Pupils: equal and reactive Iris: normal bilaterally  EARS, NOSE, MOUTH, THROAT External ears: no lesion or deformity External nose: no lesion or deformity Hearing: grossly normal Lips: no lesion or deformity Dentition: normal for age Oral mucosa: moist  NECK Symmetric: yes Trachea: midline Thyroid: no palpable nodules in the thyroid bed  CHEST Respiratory effort: normal Retraction or accessory muscle use: no Breath sounds: normal bilaterally Rales, rhonchi, wheeze: none  CARDIOVASCULAR Auscultation: regular rhythm, normal rate Murmurs: none Pulses: carotid and radial pulse 2+ palpable Lower extremity edema: none Lower extremity varicosities: none  ABDOMEN Distension: none Masses: none palpable Tenderness: none Hepatosplenomegaly: not present Hernia: Well-healed incision in the midline below the level of the umbilicus. There is an obvious bulge cephalad into the right of the umbilicus. This measures approximately 6 cm in diameter. It is examined in a standing position. It is at least partially reducible. The fascial defect appears to be near the umbilicus in the midline. It is difficult to ascertain the diameter of the fascial defect but is probably in the range of 3 cm.  MUSCULOSKELETAL Station and gait: normal Digits and nails: no clubbing or cyanosis Muscle strength: grossly normal all extremities Range of motion: grossly normal all extremities Deformity: none  LYMPHATIC Cervical: none palpable Supraclavicular: none palpable  PSYCHIATRIC Oriented to person, place, and time: yes Mood and affect: normal for situation Judgment and insight: appropriate for situation    Assessment & Plan  VENTRAL HERNIA WITHOUT OBSTRUCTION OR GANGRENE (K43.9)  Pt Education - Pamphlet Given - Hernia Surgery: discussed with patient and provided information.  Patient returns to my  practice with a new hernia defect in the abdominal  wall. This appears to be in a slightly different location than her previous umbilical hernia repair. It became evident during her recent pregnancy. It is partially reducible but somewhat symptomatic.  We reviewed options for repair. Because of the proximity of this defect to the previous repair, I do not wish to have 2 mesh patch repairs adjacent to one another or overlapping one another. I have recommended proceeding with a laparoscopic ventral hernia repair with mesh. This would allow for [place] to inspect the inside of the abdominal wall and reduced the hernia contents completely. We would then choose a large patch which would cover the previous repair as well as the new hernia defect. This would provide the best chance of long-term control and provide for the least chance of recurrence. We discussed the hospital stay to be anticipated. We discussed using an abdominal binder in the weeks following surgery. We discussed restrictions on her lifting activities. Patient understands and wishes to proceed with surgery in the near future.  The risks and benefits of the procedure have been discussed at length with the patient. The patient understands the proposed procedure, potential alternative treatments, and the course of recovery to be expected. All of the patient's questions have been answered at this time. The patient wishes to proceed with surgery.   Darnell Level, MD [facility] Surgery Office: [phone]

## 2018-11-10 DIAGNOSIS — F320 Major depressive disorder, single episode, mild: Secondary | ICD-10-CM | POA: Insufficient documentation

## 2018-11-24 ENCOUNTER — Ambulatory Visit (HOSPITAL_COMMUNITY): Admission: RE | Admit: 2018-11-24 | Payer: Medicaid Other | Source: Home / Self Care | Admitting: Surgery

## 2018-11-24 ENCOUNTER — Encounter (HOSPITAL_COMMUNITY): Admission: RE | Payer: Self-pay | Source: Home / Self Care

## 2018-11-24 SURGERY — REPAIR, HERNIA, VENTRAL, LAPAROSCOPIC
Anesthesia: General

## 2018-11-29 ENCOUNTER — Ambulatory Visit: Payer: Self-pay | Admitting: Surgery

## 2018-11-29 ENCOUNTER — Encounter: Payer: Self-pay | Admitting: Surgery

## 2018-11-29 NOTE — H&P (Signed)
General Surgery - [facility]  Angel [name] DOB: 06/15/1991 Single / Language: English / Race: White Female   History of Present Illness  The patient is a 28 year old female who presents with an abdominal wall hernia.  CHIEF COMPLAINT: ventral hernia  Patient returns to my practice having had a history of umbilical hernia repair with mesh patch in 2017. Subsequently she again became pregnant and during pregnancy, approximately 2 months in, she noted a bulge to the right and above the level of the umbilicus. This has slowly become larger. It has been at least partially reducible. It does cause her some discomfort. She presents today to discuss operative repair. She has had no signs or symptoms of intestinal obstruction. She does do some lifting as her newborn weighs approximately 9 pounds now.   Problem List/Past Medical  VISIT FOR WOUND CHECK (Z51.89)  UMBILICAL HERNIA WITHOUT OBSTRUCTION OR GANGRENE (K42.9)  VENTRAL HERNIA WITHOUT OBSTRUCTION OR GANGRENE (K43.9)   Diagnostic Studies History Pap Smear  1-5 years ago  Allergies Cefprozil *CEPHALOSPORINS*  Cefzil *CEPHALOSPORINS*  Vancomycin HCl *ANTI-INFECTIVE AGENTS - MISC.*  Allergies Reconciled   Medication History Prenatal Fe-90 (Oral) Active. Ibuprofen (600MG Tablet, Oral) Active. Tylenol (325MG Tablet, Oral as needed) Active. Medications Reconciled  Family History  Diabetes Mellitus  Father.  Other Problems Anxiety Disorder  Asthma  Back Pain  Depression  Other disease, cancer, significant illness   Vitals Weight: 203.13 lb Height: 65in Body Surface Area: 1.99 m Body Mass Index: 33.8 kg/m  Temp.: 97.9F(Oral)  Pulse: 93 (Regular)  BP: 112/58 (Sitting, Left Arm, Standard)  Physical Exam  See vital signs recorded above  GENERAL APPEARANCE Development: normal Nutritional status: normal Gross deformities: none  SKIN Rash, lesions, ulcers:  none Induration, erythema: none Nodules: none palpable  EYES Conjunctiva and lids: normal Pupils: equal and reactive Iris: normal bilaterally  EARS, NOSE, MOUTH, THROAT External ears: no lesion or deformity External nose: no lesion or deformity Hearing: grossly normal Lips: no lesion or deformity Dentition: normal for age Oral mucosa: moist  NECK Symmetric: yes Trachea: midline Thyroid: no palpable nodules in the thyroid bed  CHEST Respiratory effort: normal Retraction or accessory muscle use: no Breath sounds: normal bilaterally Rales, rhonchi, wheeze: none  CARDIOVASCULAR Auscultation: regular rhythm, normal rate Murmurs: none Pulses: carotid and radial pulse 2+ palpable Lower extremity edema: none Lower extremity varicosities: none  ABDOMEN Distension: none Masses: none palpable Tenderness: none Hepatosplenomegaly: not present Hernia: Well-healed incision in the midline below the level of the umbilicus. There is an obvious bulge cephalad into the right of the umbilicus. This measures approximately 6 cm in diameter. It is examined in a standing position. It is at least partially reducible. The fascial defect appears to be near the umbilicus in the midline. It is difficult to ascertain the diameter of the fascial defect but is probably in the range of 3 cm.  MUSCULOSKELETAL Station and gait: normal Digits and nails: no clubbing or cyanosis Muscle strength: grossly normal all extremities Range of motion: grossly normal all extremities Deformity: none  LYMPHATIC Cervical: none palpable Supraclavicular: none palpable  PSYCHIATRIC Oriented to person, place, and time: yes Mood and affect: normal for situation Judgment and insight: appropriate for situation    Assessment & Plan  VENTRAL HERNIA WITHOUT OBSTRUCTION OR GANGRENE (K43.9)  Pt Education - Pamphlet Given - Hernia Surgery: discussed with patient and provided information.  Patient returns to my  practice with a new hernia defect in the abdominal   wall. This appears to be in a slightly different location than her previous umbilical hernia repair. It became evident during her recent pregnancy. It is partially reducible but somewhat symptomatic.  We reviewed options for repair. Because of the proximity of this defect to the previous repair, I do not wish to have 2 mesh patch repairs adjacent to one another or overlapping one another. I have recommended proceeding with a laparoscopic ventral hernia repair with mesh. This would allow for [place] to inspect the inside of the abdominal wall and reduced the hernia contents completely. We would then choose a large patch which would cover the previous repair as well as the new hernia defect. This would provide the best chance of long-term control and provide for the least chance of recurrence. We discussed the hospital stay to be anticipated. We discussed using an abdominal binder in the weeks following surgery. We discussed restrictions on her lifting activities. Patient understands and wishes to proceed with surgery in the near future.  The risks and benefits of the procedure have been discussed at length with the patient. The patient understands the proposed procedure, potential alternative treatments, and the course of recovery to be expected. All of the patient's questions have been answered at this time. The patient wishes to proceed with surgery.  Darnell Level, MD [facility] Surgery Office: [phone]

## 2018-12-16 NOTE — Patient Instructions (Signed)
Angel [name]     Your procedure is scheduled on: 12-22-2018  Report to [facility] Main  Entrance  Report to admitting at [place] 19 TEST ON Monday 12-19-2018 @_______ , THIS TEST MUST BE DONE BEFORE SURGERY, COME TO [place].    Call this number if you have problems the morning of surgery [phone]    Remember: Do not eat food or drink liquids :After Midnight. BRUSH YOUR TEETH MORNING OF SURGERY AND RINSE YOUR MOUTH OUT, NO CHEWING GUM CANDY OR MINTS.     Take these medicines the morning of surgery with A SIP OF WATER: lorazepam if needed, albuterol and flovent inhalers if needed and bring inhalers, escitalopram (lexapro)                                You may not have any metal on your body including hair pins and              piercings  Do not wear jewelry, make-up, lotions, powders or perfumes, deodorant             Do not wear nail polish.  Do not shave  48 hours prior to surgery.              Men may shave face and neck.   Do not bring valuables to the hospital. [place].  Contacts, dentures or bridgework may not be worn into surgery.  Leave suitcase in the car. After surgery it may be brought to your room.      _____________________________________________________________________             [facility] - Preparing for Surgery Before surgery, you can play an important role.  Because skin is not sterile, your skin needs to be as free of germs as possible.  You can reduce the number of germs on your skin by washing with CHG (chlorahexidine gluconate) soap before surgery.  CHG is an antiseptic cleaner which kills germs and bonds with the skin to continue killing germs even after washing. Please DO NOT use if you have an allergy to CHG or antibacterial soaps.  If your skin becomes reddened/irritated stop using the CHG and inform your  nurse when you arrive at Short Stay. Do not shave (including legs and underarms) for at least 48 hours prior to the first CHG shower.  You may shave your face/neck. Please follow these instructions carefully:  1.  Shower with CHG Soap the night before surgery and the  morning of Surgery.  2.  If you choose to wash your hair, wash your hair first as usual with your  normal  shampoo.  3.  After you shampoo, rinse your hair and body thoroughly to remove the  shampoo.                           4.  Use CHG as you would any other liquid soap.  You can apply chg directly  to the skin and wash                       Gently  with a scrungie or clean washcloth.  5.  Apply the CHG Soap to your body ONLY FROM THE NECK DOWN.   Do not use on face/ open                           Wound or open sores. Avoid contact with eyes, ears mouth and genitals (private parts).                       Wash face,  Genitals (private parts) with your normal soap.             6.  Wash thoroughly, paying special attention to the area where your surgery  will be performed.  7.  Thoroughly rinse your body with warm water from the neck down.  8.  DO NOT shower/wash with your normal soap after using and rinsing off  the CHG Soap.                9.  Pat yourself dry with a clean towel.            10.  Wear clean pajamas.            11.  Place clean sheets on your bed the night of your first shower and do not  sleep with pets. Day of Surgery : Do not apply any lotions/deodorants the morning of surgery.  Please wear clean clothes to the hospital/surgery center.  FAILURE TO FOLLOW THESE INSTRUCTIONS MAY RESULT IN THE CANCELLATION OF YOUR SURGERY PATIENT SIGNATURE_________________________________  NURSE SIGNATURE__________________________________  ________________________________________________________________________

## 2018-12-19 ENCOUNTER — Encounter (HOSPITAL_COMMUNITY)
Admission: RE | Admit: 2018-12-19 | Discharge: 2018-12-19 | Disposition: A | Discharge status: Home / Self Care | Payer: Medicaid Other | Source: Ambulatory Visit | Attending: Surgery | Admitting: Surgery

## 2018-12-19 ENCOUNTER — Encounter (HOSPITAL_COMMUNITY): Payer: Self-pay

## 2018-12-19 ENCOUNTER — Other Ambulatory Visit (HOSPITAL_COMMUNITY)
Admission: RE | Admit: 2018-12-19 | Discharge: 2018-12-19 | Disposition: A | Discharge status: Home / Self Care | Payer: Medicaid Other | Source: Ambulatory Visit | Attending: Surgery | Admitting: Surgery

## 2018-12-19 ENCOUNTER — Other Ambulatory Visit: Payer: Self-pay

## 2018-12-19 ENCOUNTER — Ambulatory Visit: Payer: Self-pay | Admitting: Surgery

## 2018-12-19 DIAGNOSIS — Z1159 Encounter for screening for other viral diseases: Secondary | ICD-10-CM | POA: Insufficient documentation

## 2018-12-19 DIAGNOSIS — Z01812 Encounter for preprocedural laboratory examination: Secondary | ICD-10-CM | POA: Insufficient documentation

## 2018-12-19 LAB — CBC
HCT: 43.6 % (ref 36.0–46.0)
Hemoglobin: 13.4 g/dL (ref 12.0–15.0)
MCH: 26.3 pg (ref 26.0–34.0)
MCHC: 30.7 g/dL (ref 30.0–36.0)
MCV: 85.7 fL (ref 80.0–100.0)
Platelets: 275 10*3/uL (ref 150–400)
RBC: 5.09 MIL/uL (ref 3.87–5.11)
RDW: 12.8 % (ref 11.5–15.5)
WBC: 8.2 10*3/uL (ref 4.0–10.5)
nRBC: 0 % (ref 0.0–0.2)

## 2018-12-20 LAB — NOVEL CORONAVIRUS, NAA (HOSP ORDER, SEND-OUT TO REF LAB; TAT 18-24 HRS): SARS-CoV-2, NAA: NOT DETECTED

## 2018-12-21 ENCOUNTER — Encounter (HOSPITAL_COMMUNITY): Payer: Self-pay | Admitting: Surgery

## 2018-12-22 ENCOUNTER — Other Ambulatory Visit: Payer: Self-pay

## 2018-12-22 ENCOUNTER — Ambulatory Visit (HOSPITAL_COMMUNITY): Payer: Medicaid Other | Admitting: Certified Registered Nurse Anesthetist

## 2018-12-22 ENCOUNTER — Observation Stay (HOSPITAL_COMMUNITY)
Admission: RE | Admit: 2018-12-22 | Discharge: 2018-12-23 | Disposition: A | Discharge status: Home / Self Care | Payer: Medicaid Other | Attending: Surgery | Admitting: Surgery

## 2018-12-22 ENCOUNTER — Encounter (HOSPITAL_COMMUNITY): Payer: Self-pay

## 2018-12-22 ENCOUNTER — Ambulatory Visit (HOSPITAL_COMMUNITY): Payer: Medicaid Other | Admitting: Physician Assistant

## 2018-12-22 ENCOUNTER — Encounter (HOSPITAL_COMMUNITY)
Admission: RE | Disposition: A | Discharge status: Home / Self Care | Payer: Self-pay | Source: Home / Self Care | Attending: Surgery

## 2018-12-22 DIAGNOSIS — M549 Dorsalgia, unspecified: Secondary | ICD-10-CM | POA: Insufficient documentation

## 2018-12-22 DIAGNOSIS — Z6836 Body mass index (BMI) 36.0-36.9, adult: Secondary | ICD-10-CM | POA: Diagnosis not present

## 2018-12-22 DIAGNOSIS — F329 Major depressive disorder, single episode, unspecified: Secondary | ICD-10-CM | POA: Diagnosis not present

## 2018-12-22 DIAGNOSIS — F419 Anxiety disorder, unspecified: Secondary | ICD-10-CM | POA: Insufficient documentation

## 2018-12-22 DIAGNOSIS — Z87891 Personal history of nicotine dependence: Secondary | ICD-10-CM | POA: Diagnosis not present

## 2018-12-22 DIAGNOSIS — K439 Ventral hernia without obstruction or gangrene: Secondary | ICD-10-CM

## 2018-12-22 DIAGNOSIS — Z881 Allergy status to other antibiotic agents status: Secondary | ICD-10-CM | POA: Diagnosis not present

## 2018-12-22 DIAGNOSIS — Z833 Family history of diabetes mellitus: Secondary | ICD-10-CM | POA: Insufficient documentation

## 2018-12-22 DIAGNOSIS — Z791 Long term (current) use of non-steroidal anti-inflammatories (NSAID): Secondary | ICD-10-CM | POA: Diagnosis not present

## 2018-12-22 DIAGNOSIS — E669 Obesity, unspecified: Secondary | ICD-10-CM | POA: Diagnosis not present

## 2018-12-22 DIAGNOSIS — K432 Incisional hernia without obstruction or gangrene: Secondary | ICD-10-CM | POA: Diagnosis not present

## 2018-12-22 DIAGNOSIS — J45909 Unspecified asthma, uncomplicated: Secondary | ICD-10-CM | POA: Insufficient documentation

## 2018-12-22 HISTORY — PX: VENTRAL HERNIA REPAIR: SHX424

## 2018-12-22 LAB — PREGNANCY, URINE: Preg Test, Ur: NEGATIVE

## 2018-12-22 SURGERY — REPAIR, HERNIA, VENTRAL, LAPAROSCOPIC
Anesthesia: General | Site: Abdomen

## 2018-12-22 MED ORDER — LORAZEPAM 0.5 MG PO TABS: 0.5000 mg | ORAL_TABLET | Freq: Two times a day (BID) | ORAL | Status: DC | PRN

## 2018-12-22 MED ORDER — ROCURONIUM BROMIDE 10 MG/ML (PF) SYRINGE
PREFILLED_SYRINGE | INTRAVENOUS | Status: AC
  Filled 2018-12-22: qty 10

## 2018-12-22 MED ORDER — SUGAMMADEX SODIUM 200 MG/2ML IV SOLN
INTRAVENOUS | Status: AC
  Filled 2018-12-22: qty 2

## 2018-12-22 MED ORDER — ACETAMINOPHEN 10 MG/ML IV SOLN
INTRAVENOUS | Status: AC
  Filled 2018-12-22: qty 100

## 2018-12-22 MED ORDER — SUCCINYLCHOLINE CHLORIDE 200 MG/10ML IV SOSY
PREFILLED_SYRINGE | INTRAVENOUS | Status: AC
  Filled 2018-12-22: qty 10

## 2018-12-22 MED ORDER — MIDAZOLAM HCL 2 MG/2ML IJ SOLN
INTRAMUSCULAR | Status: AC
  Filled 2018-12-22: qty 2

## 2018-12-22 MED ORDER — HYDROMORPHONE HCL 1 MG/ML IJ SOLN: 1.0000 mg | INTRAMUSCULAR | Status: DC | PRN

## 2018-12-22 MED ORDER — CIPROFLOXACIN IN D5W 400 MG/200ML IV SOLN
400.0000 mg | INTRAVENOUS | Status: AC
  Administered 2018-12-22: 400 mg via INTRAVENOUS
  Filled 2018-12-22: qty 200

## 2018-12-22 MED ORDER — ACETAMINOPHEN 325 MG PO TABS: 650.0000 mg | ORAL_TABLET | Freq: Four times a day (QID) | ORAL | Status: DC | PRN

## 2018-12-22 MED ORDER — ACETAMINOPHEN 325 MG PO TABS: 325.0000 mg | ORAL_TABLET | Freq: Once | ORAL | Status: DC | PRN

## 2018-12-22 MED ORDER — ROCURONIUM BROMIDE 50 MG/5ML IV SOSY
PREFILLED_SYRINGE | INTRAVENOUS | Status: DC | PRN
  Administered 2018-12-22 (×2): 10 mg via INTRAVENOUS
  Administered 2018-12-22: 50 mg via INTRAVENOUS

## 2018-12-22 MED ORDER — ONDANSETRON HCL 4 MG/2ML IJ SOLN
INTRAMUSCULAR | Status: DC | PRN
  Administered 2018-12-22: 4 mg via INTRAVENOUS

## 2018-12-22 MED ORDER — MIDAZOLAM HCL 5 MG/5ML IJ SOLN
INTRAMUSCULAR | Status: DC | PRN
  Administered 2018-12-22: 2 mg via INTRAVENOUS

## 2018-12-22 MED ORDER — MEPERIDINE HCL 50 MG/ML IJ SOLN: 6.2500 mg | INTRAMUSCULAR | Status: DC | PRN

## 2018-12-22 MED ORDER — SUCCINYLCHOLINE CHLORIDE 200 MG/10ML IV SOSY
PREFILLED_SYRINGE | INTRAVENOUS | Status: DC | PRN
  Administered 2018-12-22: 125 mg via INTRAVENOUS

## 2018-12-22 MED ORDER — ACETAMINOPHEN 650 MG RE SUPP: 650.0000 mg | Freq: Four times a day (QID) | RECTAL | Status: DC | PRN

## 2018-12-22 MED ORDER — ACETAMINOPHEN 160 MG/5ML PO SOLN: 325.0000 mg | Freq: Once | ORAL | Status: DC | PRN

## 2018-12-22 MED ORDER — LIDOCAINE 2% (20 MG/ML) 5 ML SYRINGE
INTRAMUSCULAR | Status: AC
  Filled 2018-12-22: qty 5

## 2018-12-22 MED ORDER — LACTATED RINGERS IV SOLN: INTRAVENOUS | Status: DC

## 2018-12-22 MED ORDER — 0.9 % SODIUM CHLORIDE (POUR BTL) OPTIME
TOPICAL | Status: DC | PRN
  Administered 2018-12-22: 09:00:00 1000 mL

## 2018-12-22 MED ORDER — ONDANSETRON HCL 4 MG/2ML IJ SOLN
INTRAMUSCULAR | Status: AC
  Filled 2018-12-22: qty 2

## 2018-12-22 MED ORDER — PROPOFOL 10 MG/ML IV BOLUS
INTRAVENOUS | Status: AC
  Filled 2018-12-22: qty 20

## 2018-12-22 MED ORDER — ONDANSETRON 4 MG PO TBDP: 4.0000 mg | ORAL_TABLET | Freq: Four times a day (QID) | ORAL | Status: DC | PRN

## 2018-12-22 MED ORDER — FENTANYL CITRATE (PF) 100 MCG/2ML IJ SOLN
INTRAMUSCULAR | Status: DC | PRN
  Administered 2018-12-22: 100 ug via INTRAVENOUS
  Administered 2018-12-22 (×3): 50 ug via INTRAVENOUS

## 2018-12-22 MED ORDER — SUGAMMADEX SODIUM 200 MG/2ML IV SOLN
INTRAVENOUS | Status: DC | PRN
  Administered 2018-12-22: 200 mg via INTRAVENOUS

## 2018-12-22 MED ORDER — KCL IN DEXTROSE-NACL 20-5-0.45 MEQ/L-%-% IV SOLN
INTRAVENOUS | Status: DC
  Administered 2018-12-22 – 2018-12-23 (×2): via INTRAVENOUS
  Filled 2018-12-22 (×2): qty 1000

## 2018-12-22 MED ORDER — DEXAMETHASONE SODIUM PHOSPHATE 10 MG/ML IJ SOLN
INTRAMUSCULAR | Status: DC | PRN
  Administered 2018-12-22: 10 mg via INTRAVENOUS

## 2018-12-22 MED ORDER — HYDROMORPHONE HCL 1 MG/ML IJ SOLN
0.2500 mg | INTRAMUSCULAR | Status: DC | PRN
  Administered 2018-12-22 (×4): 0.5 mg via INTRAVENOUS

## 2018-12-22 MED ORDER — PROMETHAZINE HCL 25 MG/ML IJ SOLN: 6.2500 mg | INTRAMUSCULAR | Status: DC | PRN

## 2018-12-22 MED ORDER — FENTANYL CITRATE (PF) 250 MCG/5ML IJ SOLN
INTRAMUSCULAR | Status: AC
  Filled 2018-12-22: qty 5

## 2018-12-22 MED ORDER — ESCITALOPRAM OXALATE 20 MG PO TABS
20.0000 mg | ORAL_TABLET | Freq: Every day | ORAL | Status: DC
  Administered 2018-12-23: 10:00:00 20 mg via ORAL
  Filled 2018-12-22: qty 1

## 2018-12-22 MED ORDER — PHENYLEPHRINE 40 MCG/ML (10ML) SYRINGE FOR IV PUSH (FOR BLOOD PRESSURE SUPPORT)
PREFILLED_SYRINGE | INTRAVENOUS | Status: DC | PRN
  Administered 2018-12-22: 80 ug via INTRAVENOUS
  Administered 2018-12-22: 40 ug via INTRAVENOUS

## 2018-12-22 MED ORDER — HYDROCODONE-ACETAMINOPHEN 5-325 MG PO TABS
1.0000 | ORAL_TABLET | ORAL | Status: DC | PRN
  Administered 2018-12-22 (×2): 2 via ORAL
  Administered 2018-12-23: 1 via ORAL
  Administered 2018-12-23: 2 via ORAL
  Filled 2018-12-22 (×3): qty 2

## 2018-12-22 MED ORDER — LACTATED RINGERS IV SOLN
INTRAVENOUS | Status: DC
  Administered 2018-12-22: 08:00:00 via INTRAVENOUS

## 2018-12-22 MED ORDER — ALBUTEROL SULFATE (2.5 MG/3ML) 0.083% IN NEBU: 2.5000 mg | INHALATION_SOLUTION | Freq: Four times a day (QID) | RESPIRATORY_TRACT | Status: DC | PRN

## 2018-12-22 MED ORDER — TRAMADOL HCL 50 MG PO TABS
50.0000 mg | ORAL_TABLET | Freq: Four times a day (QID) | ORAL | Status: DC | PRN
  Administered 2018-12-22: 50 mg via ORAL
  Filled 2018-12-22: qty 1

## 2018-12-22 MED ORDER — ACETAMINOPHEN 10 MG/ML IV SOLN
1000.0000 mg | Freq: Once | INTRAVENOUS | Status: DC | PRN
  Administered 2018-12-22: 10:00:00 1000 mg via INTRAVENOUS

## 2018-12-22 MED ORDER — CHLORHEXIDINE GLUCONATE CLOTH 2 % EX PADS: 6.0000 | MEDICATED_PAD | Freq: Once | CUTANEOUS | Status: DC

## 2018-12-22 MED ORDER — HYDROMORPHONE HCL 1 MG/ML IJ SOLN
INTRAMUSCULAR | Status: AC
  Filled 2018-12-22: qty 2

## 2018-12-22 MED ORDER — ESMOLOL HCL 100 MG/10ML IV SOLN
INTRAVENOUS | Status: AC
  Filled 2018-12-22: qty 10

## 2018-12-22 MED ORDER — DEXAMETHASONE SODIUM PHOSPHATE 10 MG/ML IJ SOLN
INTRAMUSCULAR | Status: AC
  Filled 2018-12-22: qty 1

## 2018-12-22 MED ORDER — BUPIVACAINE-EPINEPHRINE 0.25% -1:200000 IJ SOLN
INTRAMUSCULAR | Status: DC | PRN
  Administered 2018-12-22: 20 mL

## 2018-12-22 MED ORDER — LIDOCAINE 2% (20 MG/ML) 5 ML SYRINGE
INTRAMUSCULAR | Status: DC | PRN
  Administered 2018-12-22: 60 mg via INTRAVENOUS

## 2018-12-22 MED ORDER — PROPOFOL 10 MG/ML IV BOLUS
INTRAVENOUS | Status: DC | PRN
  Administered 2018-12-22: 150 mg via INTRAVENOUS

## 2018-12-22 MED ORDER — BUPIVACAINE-EPINEPHRINE (PF) 0.25% -1:200000 IJ SOLN
INTRAMUSCULAR | Status: AC
  Filled 2018-12-22: qty 30

## 2018-12-22 MED ORDER — ONDANSETRON HCL 4 MG/2ML IJ SOLN
4.0000 mg | Freq: Four times a day (QID) | INTRAMUSCULAR | Status: DC | PRN
  Administered 2018-12-22: 4 mg via INTRAVENOUS
  Filled 2018-12-22: qty 2

## 2018-12-22 SURGICAL SUPPLY — 42 items
ADH SKN CLS APL DERMABOND .7 (GAUZE/BANDAGES/DRESSINGS) ×1
APL PRP STRL LF DISP 70% ISPRP (MISCELLANEOUS) ×1
BINDER ABDOMINAL 12 ML 46-62 (SOFTGOODS) ×3 IMPLANT
CHLORAPREP W/TINT 26 (MISCELLANEOUS) ×4 IMPLANT
CLOSURE WOUND 1/2 X4 (GAUZE/BANDAGES/DRESSINGS)
COVER SURGICAL LIGHT HANDLE (MISCELLANEOUS) ×3 IMPLANT
COVER WAND RF STERILE (DRAPES) IMPLANT
DECANTER SPIKE VIAL GLASS SM (MISCELLANEOUS) ×3 IMPLANT
DERMABOND ADVANCED (GAUZE/BANDAGES/DRESSINGS) ×2
DERMABOND ADVANCED .7 DNX12 (GAUZE/BANDAGES/DRESSINGS) IMPLANT
DEVICE SECURE STRAP 25 ABSORB (INSTRUMENTS) ×2 IMPLANT
DEVICE TROCAR PUNCTURE CLOSURE (ENDOMECHANICALS) ×3 IMPLANT
DRAPE INCISE IOBAN 66X45 STRL (DRAPES) IMPLANT
DRAPE UTILITY XL STRL (DRAPES) ×3 IMPLANT
ELECT REM PT RETURN 15FT ADLT (MISCELLANEOUS) ×3 IMPLANT
GLOVE SURG ORTHO 8.0 STRL STRW (GLOVE) ×3 IMPLANT
GOWN STRL REUS W/TWL XL LVL3 (GOWN DISPOSABLE) ×6 IMPLANT
KIT BASIN OR (CUSTOM PROCEDURE TRAY) ×3 IMPLANT
KIT TURNOVER KIT A (KITS) IMPLANT
MARKER SKIN DUAL TIP RULER LAB (MISCELLANEOUS) ×3 IMPLANT
MESH VENTRALIGHT ST 6X8 (Mesh Specialty) ×3 IMPLANT
MESH VENTRLGHT ELLIPSE 8X6XMFL (Mesh Specialty) IMPLANT
NDL SPNL 22GX3.5 QUINCKE BK (NEEDLE) ×1 IMPLANT
NEEDLE SPNL 22GX3.5 QUINCKE BK (NEEDLE) ×3 IMPLANT
PAD POSITIONING PINK XL (MISCELLANEOUS) IMPLANT
PROTECTOR NERVE ULNAR (MISCELLANEOUS) IMPLANT
SCISSORS LAP 5X35 DISP (ENDOMECHANICALS) ×2 IMPLANT
SET IRRIG TUBING LAPAROSCOPIC (IRRIGATION / IRRIGATOR) IMPLANT
SET TUBE SMOKE EVAC HIGH FLOW (TUBING) ×3 IMPLANT
SHEARS HARMONIC ACE PLUS 36CM (ENDOMECHANICALS) IMPLANT
SOLUTION ANTI FOG 6CC (MISCELLANEOUS) ×3 IMPLANT
STRIP CLOSURE SKIN 1/2X4 (GAUZE/BANDAGES/DRESSINGS) ×2 IMPLANT
SUT NOVA NAB DX-16 0-1 5-0 T12 (SUTURE) ×4 IMPLANT
TACKER 5MM HERNIA 3.5CML NAB (ENDOMECHANICALS) IMPLANT
TAPE CLOTH 4X10 WHT NS (GAUZE/BANDAGES/DRESSINGS) ×2 IMPLANT
TOWEL OR 17X26 10 PK STRL BLUE (TOWEL DISPOSABLE) ×3 IMPLANT
TOWEL OR NON WOVEN STRL DISP B (DISPOSABLE) ×3 IMPLANT
TRAY FOLEY MTR SLVR 16FR STAT (SET/KITS/TRAYS/PACK) ×1 IMPLANT
TRAY LAPAROSCOPIC (CUSTOM PROCEDURE TRAY) ×3 IMPLANT
TROCAR BLADELESS OPT 5 100 (ENDOMECHANICALS) ×3 IMPLANT
TROCAR OPTI TIP 5M 100M (ENDOMECHANICALS) ×4 IMPLANT
TROCAR OPTICAL STANDARD 11MM (TROCAR) ×2 IMPLANT

## 2018-12-22 NOTE — Anesthesia Postprocedure Evaluation (Signed)
Anesthesia Post Note  Patient: ADELE MILSON  Procedure(s) Performed: LAPAROSCOPIC VENTRAL HERNIA REPAIR WITH MESH (N/A Abdomen)     Patient location during evaluation: PACU Anesthesia Type: General Level of consciousness: awake and alert Pain management: pain level controlled Vital Signs Assessment: post-procedure vital signs reviewed and stable Respiratory status: spontaneous breathing, nonlabored ventilation, respiratory function stable and patient connected to nasal cannula oxygen Cardiovascular status: blood pressure returned to baseline and stable Postop Assessment: no apparent nausea or vomiting Anesthetic complications: no    Last Vitals:  Vitals:   12/22/18 1126 12/22/18 1230  BP: 115/78 101/62  Pulse: 91 76  Resp: 16 18  Temp: 36.8 C 36.8 C  SpO2: 97% 97%    Last Pain:  Vitals:   12/22/18 1230  TempSrc: Oral  PainSc:                  Effie Berkshire

## 2018-12-22 NOTE — Op Note (Signed)
Operative Note  Pre-operative Diagnosis:  Ventral hernia  Post-operative Diagnosis:  same  Surgeon:  Darnell Levelodd Odelle Kosier, MD  Assistant:  none   Procedure:  Laparoscopic ventral hernia repair with mesh (15x20cm Ventralex ST)  Anesthesia:  general  Estimated Blood Loss:  minimal  Drains: none         Specimen: none  Indications:  Patient returns to my practice having had a history of umbilical hernia repair with mesh patch in 2017. Subsequently she again became pregnant and during pregnancy, approximately 2 months in, she noted a bulge to the right and above the level of the umbilicus. This has slowly become larger. It has been at least partially reducible. It does cause her some discomfort. She presents today to discuss operative repair.   Procedure Details:  The patient was seen in the pre-op holding area. The risks, benefits, complications, treatment options, and expected outcomes were previously discussed with the patient. The patient agreed with the proposed plan and has signed the informed consent form.  The patient was brought to the operating room by the surgical team, identified as Adron Beneaylor M Mason and the procedure verified. A "time out" was completed and the above information confirmed.  Following induction of general endotracheal anesthesia, the patient is positioned and then prepped and draped in usual aseptic fashion.  After ascertaining that an adequate level of anesthesia been achieved, an incision is made in the left upper quadrant with a #15 blade.  Using a 5 mm Optiview trocar, the peritoneal cavity is accessed.  Pneumoperitoneum is established.  Laparoscope was introduced.  There is a single adhesion of omentum into the hernia defect in the upper abdomen to the left of midline.  Additional operative ports were placed in the left lower quadrant, right lower quadrant, and right upper quadrant under direct vision.  The adhesion is delivered out of the hernia sac and back within the  peritoneal cavity.  Hemostasis is obtained on the omentum with the electrocautery.  Fascial defect measures only approximately 2 cm in diameter.  Inspection of the anterior abdominal wall shows the previous hernia repair at the umbilicus.  This is intact.  There is the new fascial defect which is to the left and superior to the previous repair.  There is also some attenuation of the fascia around the area of the umbilicus.  A decision is made to reinforce this entire area with a large mesh patch.  A 15 x 20 cm patch is selected.  Mesh was prepared in the usual fashion using 8 equally spaced #1 Novafil sutures around the perimeter.  Mesh was then moistened and rolled and inserted into the peritoneal cavity where it is deployed and properly oriented.  Incisions are made on the abdominal wall at the site of each suture placement.  Suture tags are retrieved with the Endo Catch.  Sutures are then pulled taut against the abdominal wall elevating the mesh and widely covering the fascial defect in the area of attenuation.  All sutures are tied securely.  Next the mesh is fixed to the peritoneum circumferentially using the secure strap.  2 concentric rows of secure strap are placed with good approximation of the mesh to the anterior abdominal wall and broad coverage of the fascial defect.  Pneumoperitoneum is released and evacuated.  Port sites are anesthetized with local anesthetic.  Wounds are closed with interrupted 4-0 Monocryl subcuticular sutures.  Wounds are washed and dried and Dermabond was placed as dressing.  An abdominal binder is placed.  Patient is awakened from anesthesia and taken to the recovery room in stable condition.  The patient tolerated the procedure well.   Armandina Gemma, MD [facility] Office: [phone]

## 2018-12-22 NOTE — Anesthesia Procedure Notes (Signed)
Procedure Name: Intubation Date/Time: 12/22/2018 8:35 AM Performed by: Maxwell Caul, CRNA Pre-anesthesia Checklist: Patient identified, Emergency Drugs available, Suction available and Patient being monitored Patient Re-evaluated:Patient Re-evaluated prior to induction Oxygen Delivery Method: Circle system utilized Preoxygenation: Pre-oxygenation with 100% oxygen Induction Type: IV induction Laryngoscope Size: Mac and 3 Grade View: Grade I Tube type: Oral Tube size: 7.5 mm Number of attempts: 1 Airway Equipment and Method: Stylet Placement Confirmation: ETT inserted through vocal cords under direct vision,  positive ETCO2 and breath sounds checked- equal and bilateral Secured at: 21 cm Tube secured with: Tape Dental Injury: Teeth and Oropharynx as per pre-operative assessment

## 2018-12-22 NOTE — Anesthesia Preprocedure Evaluation (Addendum)
Anesthesia Evaluation  Patient identified by MRN, date of birth, ID band Patient awake    Reviewed: Allergy & Precautions, NPO status , Patient's Chart, lab work & pertinent test results  Airway Mallampati: II  TM Distance: >3 FB Neck ROM: Full    Dental  (+) Teeth Intact, Dental Advisory Given   Pulmonary asthma , former smoker,    breath sounds clear to auscultation       Cardiovascular negative cardio ROS   Rhythm:Regular Rate:Normal     Neuro/Psych Anxiety    GI/Hepatic negative GI ROS, Neg liver ROS,   Endo/Other  negative endocrine ROS  Renal/GU negative Renal ROS     Musculoskeletal negative musculoskeletal ROS (+)   Abdominal (+) + obese,   Peds  Hematology negative hematology ROS (+)   Anesthesia Other Findings   Reproductive/Obstetrics                            Anesthesia Physical Anesthesia Plan  ASA: II  Anesthesia Plan: General   Post-op Pain Management:    Induction: Intravenous  PONV Risk Score and Plan: 4 or greater and Ondansetron, Dexamethasone, Midazolam and Scopolamine patch - Pre-op  Airway Management Planned: Oral ETT  Additional Equipment: None  Intra-op Plan:   Post-operative Plan: Extubation in OR  Informed Consent: I have reviewed the patients History and Physical, chart, labs and discussed the procedure including the risks, benefits and alternatives for the proposed anesthesia with the patient or authorized representative who has indicated his/her understanding and acceptance.     Dental advisory given  Plan Discussed with: CRNA  Anesthesia Plan Comments:        Anesthesia Quick Evaluation

## 2018-12-22 NOTE — Interval H&P Note (Signed)
History and Physical Interval Note:  12/22/2018 8:16 AM  Angel [name]  has presented today for surgery, with the diagnosis of VENTRAL HERNIA.  The various methods of treatment have been discussed with the patient and family. After consideration of risks, benefits and other options for treatment, the patient has consented to    Procedure(s): [place] (N/A) as a surgical intervention.    The patient's history has been reviewed, patient examined, no change in status, stable for surgery.  I have reviewed the patient's chart and labs.  Questions were answered to the patient's satisfaction.    Armandina Gemma, [facility] Office: [place]

## 2018-12-22 NOTE — Transfer of Care (Signed)
Immediate Anesthesia Transfer of Care Note  Patient: Angel [name]  Procedure(s) Performed: LAPAROSCOPIC VENTRAL HERNIA REPAIR WITH MESH (N/A Abdomen)  Patient Location: PACU  Anesthesia Type:General  Level of Consciousness: awake, alert  and oriented  Airway & Oxygen Therapy: Patient Spontanous Breathing and Patient connected to face mask oxygen  Post-op Assessment: Report given to RN and Post -op Vital signs reviewed and stable  Post vital signs: Reviewed and stable  Last Vitals:  Vitals Value Taken Time  BP 110/64 12/22/18 1010  Temp    Pulse 100 12/22/18 1011  Resp 16 12/22/18 1011  SpO2 100 % 12/22/18 1011  Vitals shown include unvalidated device data.  Last Pain:  Vitals:   12/22/18 0724  TempSrc:   PainSc: 0-No pain         Complications: No apparent anesthesia complications

## 2018-12-23 ENCOUNTER — Encounter (HOSPITAL_COMMUNITY): Payer: Self-pay | Admitting: Surgery

## 2018-12-23 DIAGNOSIS — K432 Incisional hernia without obstruction or gangrene: Secondary | ICD-10-CM | POA: Diagnosis not present

## 2018-12-23 LAB — GLUCOSE, CAPILLARY: Glucose-Capillary: 136 mg/dL — ABNORMAL HIGH (ref 70–99)

## 2018-12-23 MED ORDER — HYDROCODONE-ACETAMINOPHEN 5-325 MG PO TABS: 1.0000 | ORAL_TABLET | ORAL | 0 refills | Status: DC | PRN

## 2018-12-23 NOTE — Discharge Summary (Signed)
Physician Discharge Summary [facility]  Patient ID: Angel [name] MRN: [id number] DOB/AGE: November 03, 1990 28 y.o.  Admit date: 12/22/2018 Discharge date: 12/23/2018  Admission Diagnoses:  Ventral hernia  Discharge Diagnoses:  Principal Problem:   Ventral hernia Active Problems:   Ventral incisional hernia   Discharged Condition: good  Hospital Course: Patient was admitted for observation following laparoscopic ventral hernia surgery.  Post op course was uncomplicated.  Pain was well controlled.  Tolerated diet.  Patient was prepared for discharge home on POD#1.  Consults: None  Treatments: surgery: laparoscopic ventral hernia repair with mesh  Discharge Exam: Blood pressure 122/78, pulse 87, temperature (!) 97.5 F (36.4 C), temperature source Oral, resp. rate 18, height 5\' 5"  (1.651 m), weight 98.9 kg, last menstrual period 12/18/2018, SpO2 97 %, not currently breastfeeding. HEENT - clear Neck - soft Chest - clear bilaterally Cor - RRR Abd - binder in place; dry and intact wounds; voiding  Disposition: Home  Discharge Instructions    Diet - low sodium heart healthy   Complete by: As directed    Discharge instructions   Complete by: As directed    [facility] [place]  Always review your discharge instruction sheet given to you by the facility where your surgery was performed.  A  prescription for pain medication may be given to you upon discharge.  Take your pain medication as prescribed.  If narcotic pain medicine is not needed, then you may take acetaminophen (Tylenol) or ibuprofen (Advil) as needed.  Take your usually prescribed medications unless otherwise directed.  If you need a refill on your pain medication, please contact your pharmacy.  They will contact our office to request authorization. Prescriptions will not be filled after 5 pm daily or on weekends.  You should follow a light diet  the first 24 hours after arrival home, such as soup and crackers or toast.  Be sure to include plenty of fluids daily.  Resume your normal diet the day after surgery.  Most patients will experience some swelling and bruising around the surgical site.  Ice packs and reclining will help.  Swelling and bruising can take several days to resolve.   It is common to experience some constipation if taking pain medication after surgery.  Increasing fluid intake and taking a stool softener (such as Colace) will usually help or prevent this problem from occurring.  A mild laxative (Milk of Magnesia or Miralax) should be taken according to package directions if there are no bowel movements after 48 hours.  You will likely have Dermabond (topical glue) over your incisions.  This seals the incisions and allows you to bathe and shower at any time after your surgery.  Glue should remain in place for up to 10 days.  It may be removed after 10 days by pealing off the Dermabond material or using Vaseline or naval jelly to remove.  If you have steri-strips over your incisions, you may remove the gauze bandage on the second day after surgery, and you may shower at that time.  Leave your steri-strips (small skin tapes) in place directly over the incision.  These strips should remain on the skin for 5-7 days and then be removed.  You may get them wet in the shower and pat them dry.  ACTIVITIES:  You may resume regular (light) daily activities beginning the next day - such as daily self-care, walking, climbing stairs - gradually increasing activities  as tolerated.  You may have sexual intercourse when it is comfortable.  Refrain from any heavy lifting or straining until approved by your doctor.  You may drive when you are no longer taking prescription pain medication, when you can comfortably wear a seatbelt, and when you can safely maneuver your car and apply brakes.  You should see your doctor in the office for a follow-up  appointment approximately 2-3 weeks after your surgery.  Make sure that you call for this appointment within a day or two after you arrive home to insure a convenient appointment time.  WHEN TO CALL YOUR DOCTOR: Fever greater than 101.0 Inability to urinate Persistent nausea and/or vomiting Extreme swelling or bruising Continued bleeding from incision Increased pain, redness, or drainage from the incision  The clinic staff is available to answer your questions during regular business hours.  Please don't hesitate to call and ask to speak to one of the nurses for clinical concerns.  If you have a medical emergency, go to the nearest emergency room or call 911.  [facility] is always on call for the hospital.   [facility] [address], [address], [place], [place]  [address]  [phone] ? [phone] ? [phone] [phone]  [email or web address]   Increase activity slowly   Complete by: As directed    No dressing needed   Complete by: As directed      Allergies as of 12/23/2018      Reactions   Cefzil [cefprozil] Shortness Of Breath, Swelling   Augmentin [amoxicillin-pot Clavulanate] Rash   Did it involve swelling of the face/tongue/throat, SOB, or low BP? No Did it involve sudden or severe rash/hives, skin peeling, or any reaction on the inside of your mouth or nose? Yes Did you need to seek medical attention at a hospital or doctor's office? Yes When did it last happen? 2012 If all above answers are "NO", may proceed with cephalosporin use.   Vancomycin Itching, Rash      Medication List    TAKE these medications   acetaminophen 500 MG tablet Commonly known as: TYLENOL Take 2 tablets (1,000 mg total) by mouth every 6 (six) hours as needed.   albuterol 108 (90 Base) MCG/ACT inhaler Commonly known as: VENTOLIN HFA Inhale 2 puffs into the lungs every 6 (six) hours as needed for wheezing or shortness of  breath.   escitalopram 20 MG tablet Commonly known as: LEXAPRO Take 20 mg by mouth daily.   fluticasone 44 MCG/ACT inhaler Commonly known as: FLOVENT HFA Inhale 2 puffs into the lungs 2 (two) times daily as needed (cough).   HYDROcodone-acetaminophen 5-325 MG tablet Commonly known as: NORCO/VICODIN Take 1-2 tablets by mouth every 4 (four) hours as needed for moderate pain.   ibuprofen 600 MG tablet Commonly known as: ADVIL Take 1 tablet (600 mg total) by mouth every 6 (six) hours as needed.   LORazepam 0.5 MG tablet Commonly known as: ATIVAN Take 0.5 mg by mouth 2 (two) times daily as needed for anxiety.   polyethylene glycol 17 g packet Commonly known as: MIRALAX / GLYCOLAX Take 17 g by mouth daily as needed for mild constipation.        Velora Hecklerodd M. Briarrose Shor, MD, [facility] Office: [phone]   Signed: Darnell Levelodd Theodoro Koval 12/23/2018, 10:57 AM

## 2018-12-23 NOTE — TOC Transition Note (Signed)
Transition of Care [facility]) - CM/SW Discharge Note   Patient Details  Name: Angel [name] MRN: [id number] Date of Birth: 07-25-1990  [facility] of [facility]) CM/SW Contact:  Leeroy Cha, RN Phone Number: 12/23/2018, 11:24 AM   Clinical Narrative:    dcd to home   Final next level of care: Home/Self Care Barriers to Discharge: No Barriers Identified   Patient Goals and CMS Choice Patient states their goals for this hospitalization and ongoing recovery are:: not given CMS Medicare.gov Compare Post Acute Care list provided to:: Patient    Discharge Placement                       Discharge Plan and Services   Discharge Planning Services: CM Consult                                 Social Determinants of Health (SDOH) Interventions     Readmission Risk Interventions No flowsheet data found.

## 2018-12-23 NOTE — Plan of Care (Signed)
All goals met for discharge. D/C instructions given to patient. Patient has no questions. NT or writer will wheel patient outside once family comes to pick her up

## 2019-02-20 DIAGNOSIS — J4540 Moderate persistent asthma, uncomplicated: Secondary | ICD-10-CM | POA: Insufficient documentation

## 2019-06-04 DIAGNOSIS — E611 Iron deficiency: Secondary | ICD-10-CM | POA: Insufficient documentation

## 2019-10-02 IMAGING — CT CT RENAL STONE PROTOCOL
2 of 4 series · 16 of 46 positions shown, 18 images · non-contrast
Comparison: 03/04/2011

CLINICAL DATA: Right flank pain for 5 days.

EXAM:
CT ABDOMEN AND PELVIS WITHOUT CONTRAST
TECHNIQUE: Multidetector CT imaging of the abdomen and pelvis was performed
following the standard protocol without IV contrast.

[Series 2: axial st · axial · 0.89mm/px · z∈[-587,-162]mm · 13 of 97 slices shown, 15 images]
[im 6/97  soft-tissue]
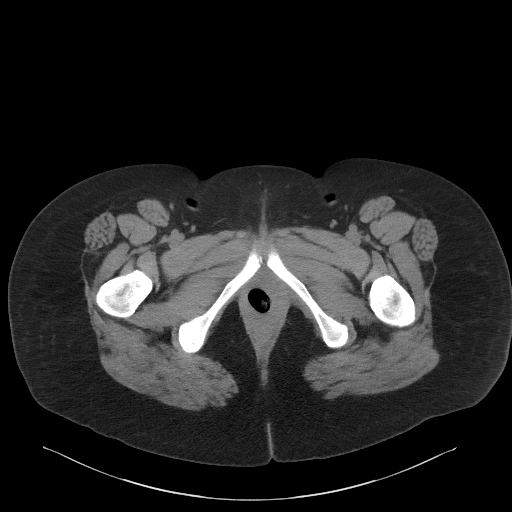
[im 6/97  bone]
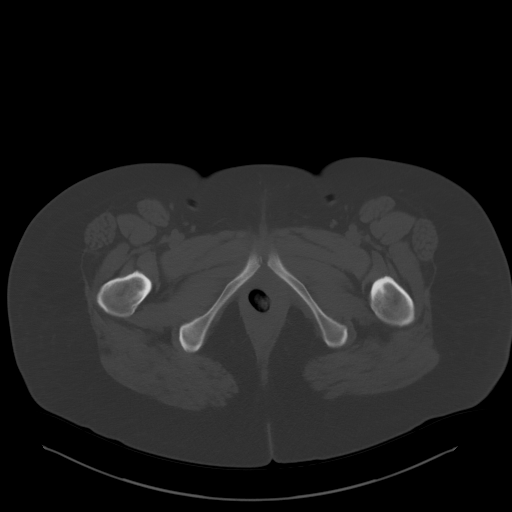
[im 12/97  soft-tissue]
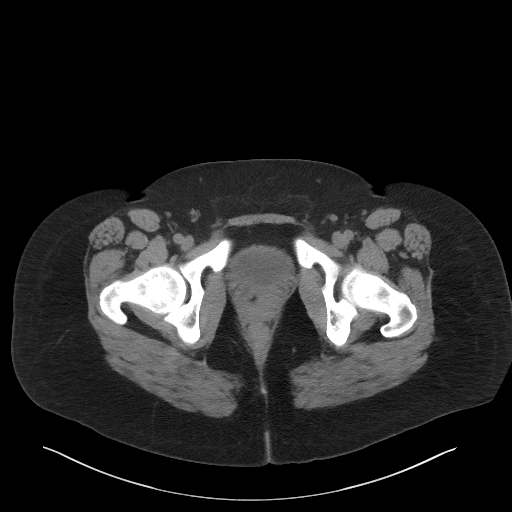
[im 23/97  soft-tissue]
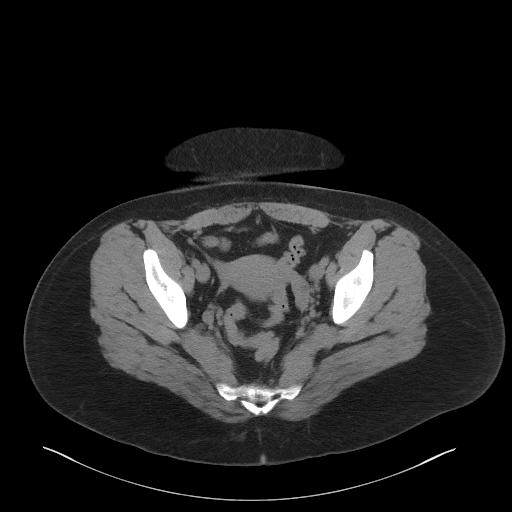
[im 29/97  soft-tissue]
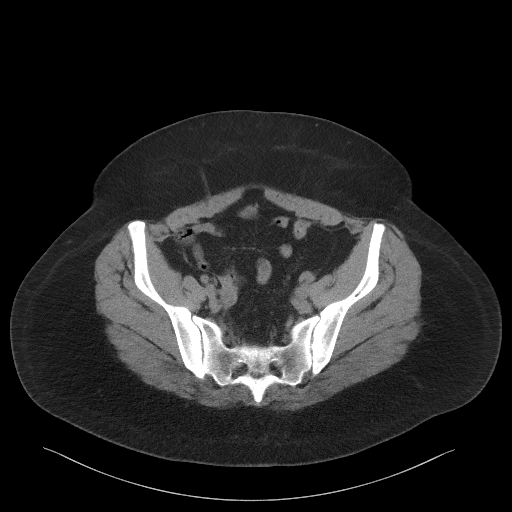
[im 34/97  soft-tissue]
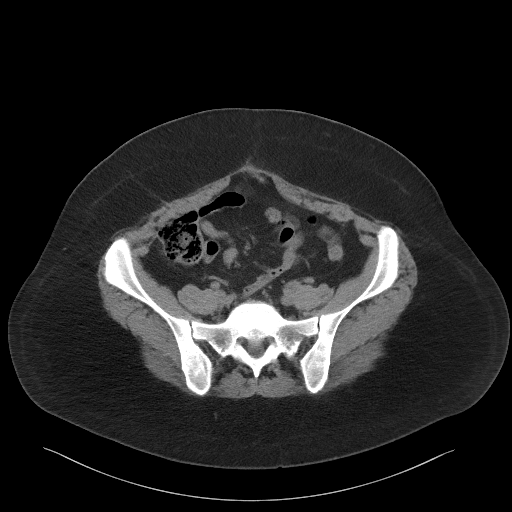
[im 40/97  soft-tissue]
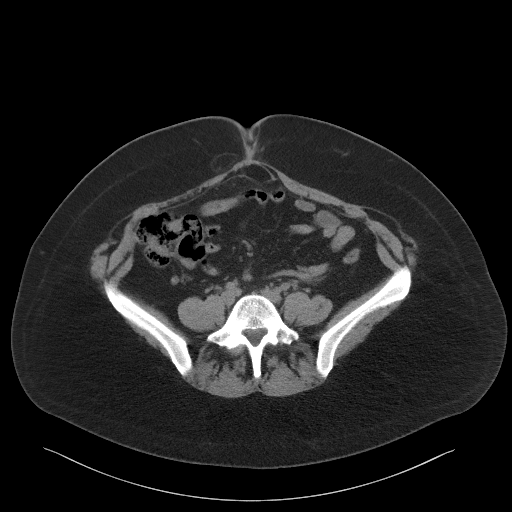
[im 51/97  soft-tissue]
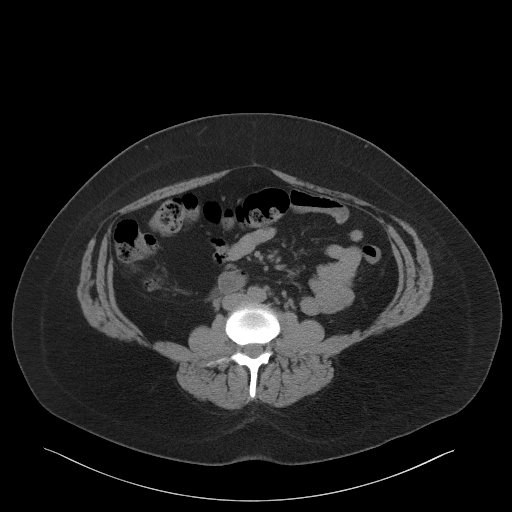
[im 57/97  soft-tissue]
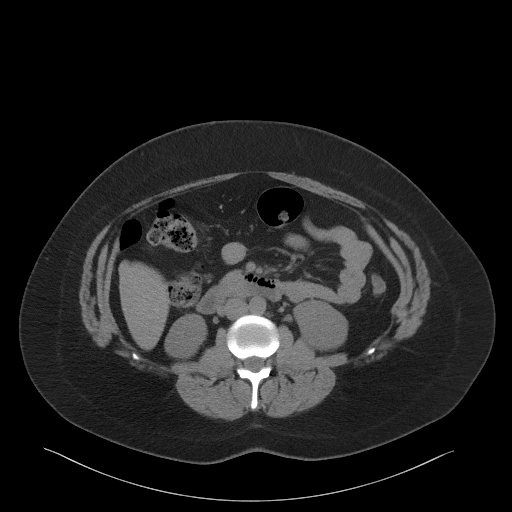
[im 63/97  soft-tissue]
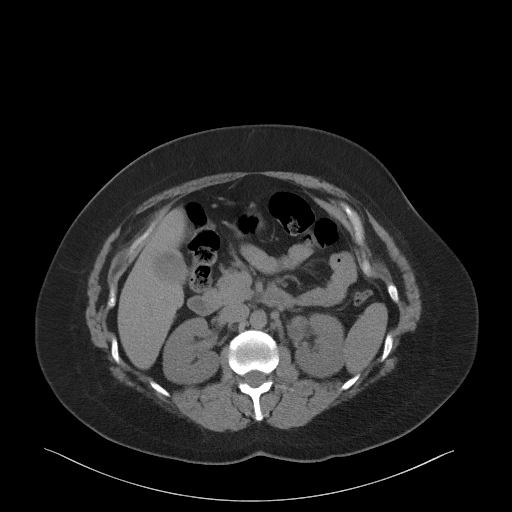
[im 63/97  bone]
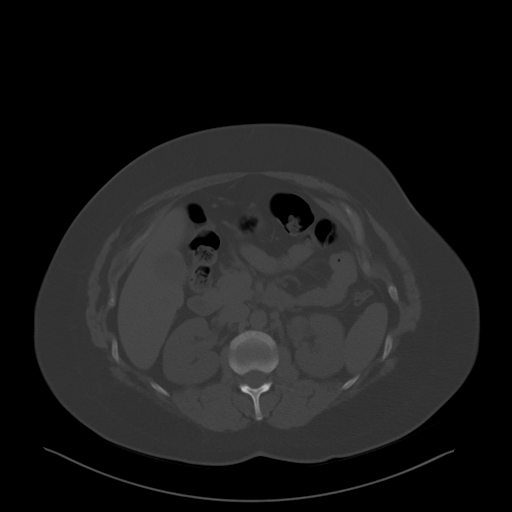
[im 68/97  soft-tissue]
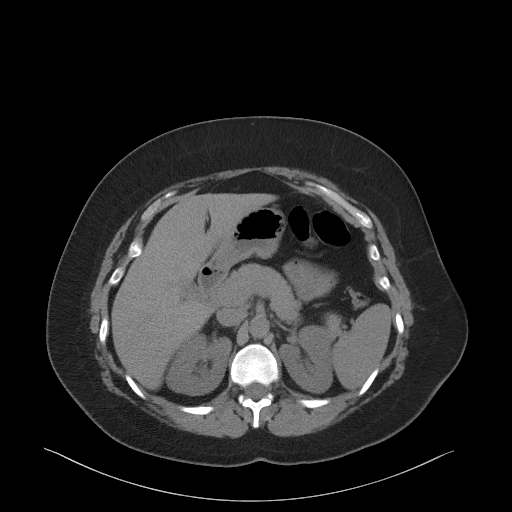
[im 74/97  soft-tissue]
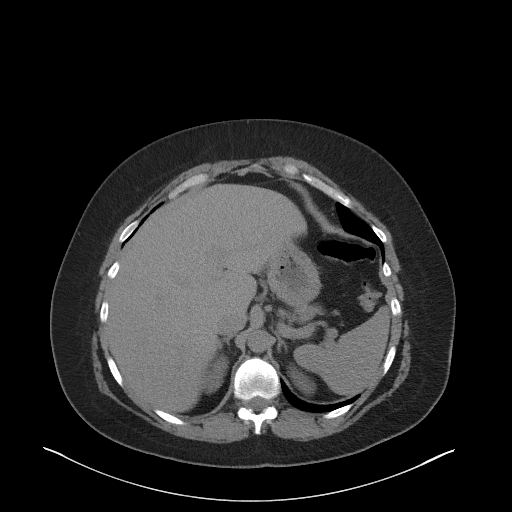
[im 85/97  soft-tissue]
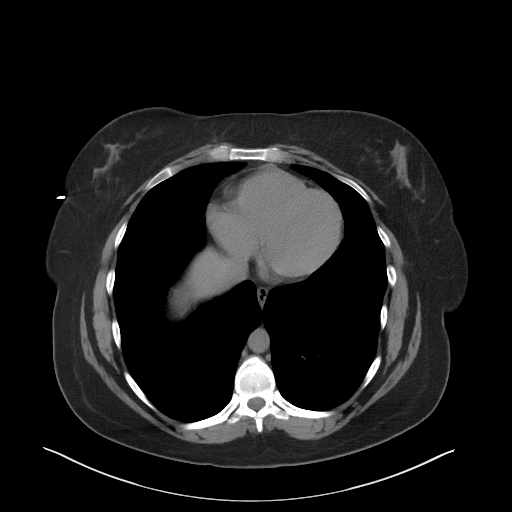
[im 91/97  soft-tissue]
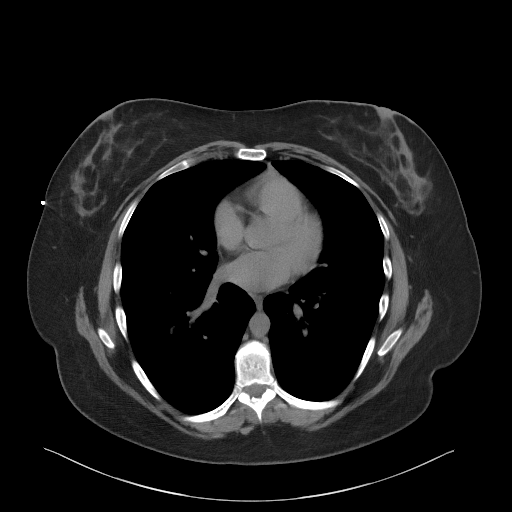

[Series 5: coronal · coronal · 0.83mm/px · 3 of 173 slices shown]
[im 58/173  soft-tissue]
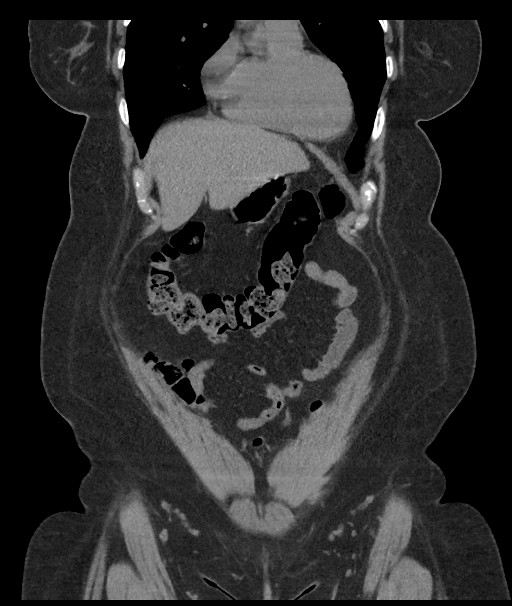
[im 77/173  soft-tissue]
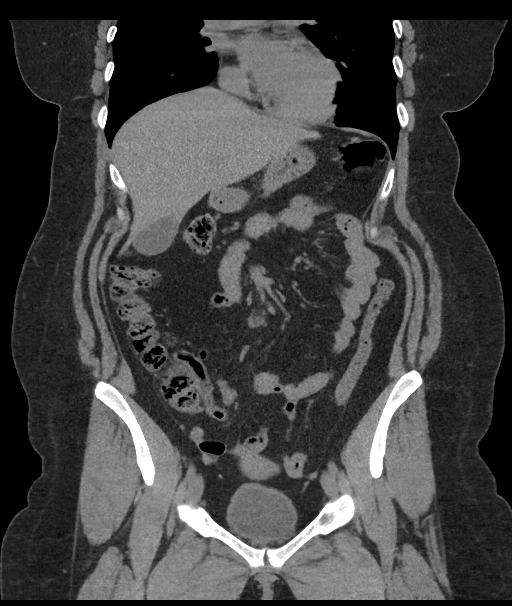
[im 96/173  soft-tissue]
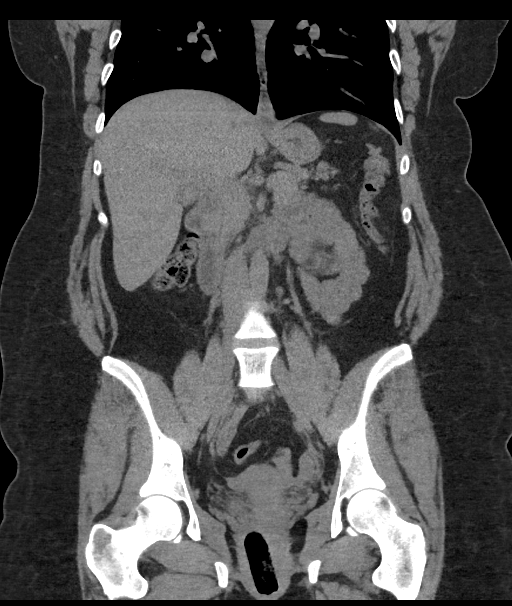

[16 of 46 positions shown; findings below may reference images not displayed]

FINDINGS: Lower chest: No acute findings.

Hepatobiliary: No mass visualized on this unenhanced exam. At least
1 tiny calcified gallstone is seen. No evidence of cholecystitis or
biliary dilatation.

Pancreas: No mass or inflammatory process visualized on this
unenhanced exam.

Spleen:  Within normal limits in size.

Adrenals/Urinary tract: No evidence of urolithiasis or
hydronephrosis. Unremarkable unopacified urinary bladder.

Stomach/Bowel: No evidence of obstruction, inflammatory process, or
abnormal fluid collections. Normal appendix visualized.

Vascular/Lymphatic: No pathologically enlarged lymph nodes
identified. No evidence of abdominal aortic aneurysm.

Reproductive:  No mass or other significant abnormality.

Other: A small right paraumbilical ventral hernia is seen containing
only fat. No herniated bowel loops.

Musculoskeletal:  No suspicious bone lesions identified.
IMPRESSION: No evidence of urolithiasis, hydronephrosis, or other acute
findings.

Cholelithiasis.  No radiographic evidence of cholecystitis.

Small right paraumbilical ventral hernia containing only fat.

## 2020-05-27 ENCOUNTER — Other Ambulatory Visit (HOSPITAL_COMMUNITY): Payer: Self-pay | Admitting: Family Medicine

## 2020-05-27 MED FILL — SULFAMETHOXAZOLE-TMP DS TAB: 800-160 | 5 days supply | Qty: 10 | Fill #0

## 2020-06-16 DIAGNOSIS — E66811 Obesity, class 1: Secondary | ICD-10-CM | POA: Insufficient documentation

## 2020-06-16 DIAGNOSIS — E6609 Other obesity due to excess calories: Secondary | ICD-10-CM | POA: Insufficient documentation

## 2020-11-11 ENCOUNTER — Other Ambulatory Visit (HOSPITAL_COMMUNITY): Payer: Self-pay

## 2020-11-11 MED ORDER — PROGESTERONE 200 MG PO CAPS
200.0000 mg | ORAL_CAPSULE | Freq: Every evening | ORAL | 0 refills | Status: DC
  Filled 2020-11-11: qty 10, 10d supply, fill #0

## 2021-11-12 DIAGNOSIS — K5900 Constipation, unspecified: Secondary | ICD-10-CM | POA: Insufficient documentation

## 2021-11-12 DIAGNOSIS — J45909 Unspecified asthma, uncomplicated: Secondary | ICD-10-CM | POA: Insufficient documentation

## 2021-11-25 ENCOUNTER — Other Ambulatory Visit (HOSPITAL_COMMUNITY): Payer: Self-pay

## 2021-11-25 MED ORDER — POLYMYXIN B-TRIMETHOPRIM 10000-0.1 UNIT/ML-% OP SOLN
1.0000 [drp] | Freq: Every day | OPHTHALMIC | 0 refills | Status: DC
  Filled 2021-11-25: qty 10, 5d supply, fill #0

## 2022-01-28 DIAGNOSIS — K760 Fatty (change of) liver, not elsewhere classified: Secondary | ICD-10-CM | POA: Insufficient documentation

## 2022-04-02 ENCOUNTER — Other Ambulatory Visit: Payer: Self-pay | Admitting: Family Medicine

## 2022-04-02 DIAGNOSIS — K760 Fatty (change of) liver, not elsewhere classified: Secondary | ICD-10-CM

## 2022-04-10 ENCOUNTER — Ambulatory Visit
Admission: RE | Admit: 2022-04-10 | Discharge: 2022-04-10 | Disposition: A | Discharge status: Home / Self Care | Payer: Medicaid Other | Source: Ambulatory Visit | Attending: Family Medicine | Admitting: Family Medicine

## 2022-04-10 DIAGNOSIS — K760 Fatty (change of) liver, not elsewhere classified: Secondary | ICD-10-CM

## 2022-08-03 ENCOUNTER — Ambulatory Visit: Payer: Medicaid Other | Admitting: Pulmonary Disease

## 2022-08-03 ENCOUNTER — Encounter: Payer: Self-pay | Admitting: Pulmonary Disease

## 2022-08-03 VITALS — BP 110/70 | HR 71 | Ht 65.0 in | Wt 213.6 lb

## 2022-08-03 DIAGNOSIS — J45998 Other asthma: Secondary | ICD-10-CM | POA: Diagnosis not present

## 2022-08-03 DIAGNOSIS — J4540 Moderate persistent asthma, uncomplicated: Secondary | ICD-10-CM

## 2022-08-03 DIAGNOSIS — Z8709 Personal history of other diseases of the respiratory system: Secondary | ICD-10-CM | POA: Diagnosis not present

## 2022-08-03 LAB — CBC WITH DIFFERENTIAL/PLATELET
Basophils Absolute: 0.1 10*3/uL (ref 0.0–0.1)
Basophils Relative: 1.3 % (ref 0.0–3.0)
Eosinophils Absolute: 1.4 10*3/uL — ABNORMAL HIGH (ref 0.0–0.7)
Eosinophils Relative: 14.6 % — ABNORMAL HIGH (ref 0.0–5.0)
HCT: 42.6 % (ref 36.0–46.0)
Hemoglobin: 14.5 g/dL (ref 12.0–15.0)
Lymphocytes Relative: 25.2 % (ref 12.0–46.0)
Lymphs Abs: 2.4 10*3/uL (ref 0.7–4.0)
MCHC: 34 g/dL (ref 30.0–36.0)
MCV: 85.6 fl (ref 78.0–100.0)
Monocytes Absolute: 0.9 10*3/uL (ref 0.1–1.0)
Monocytes Relative: 9.5 % (ref 3.0–12.0)
Neutro Abs: 4.6 10*3/uL (ref 1.4–7.7)
Neutrophils Relative %: 49.4 % (ref 43.0–77.0)
Platelets: 286 10*3/uL (ref 150.0–400.0)
RBC: 4.98 Mil/uL (ref 3.87–5.11)
RDW: 14.4 % (ref 11.5–15.5)
WBC: 9.4 10*3/uL (ref 4.0–10.5)

## 2022-08-03 MED ORDER — INCRUSE ELLIPTA 62.5 MCG/ACT IN AEPB: 1.0000 | INHALATION_SPRAY | Freq: Every day | RESPIRATORY_TRACT | 2 refills | Status: DC

## 2022-08-03 MED ORDER — PREDNISONE 10 MG PO TABS: ORAL_TABLET | ORAL | 0 refills | Status: DC

## 2022-08-03 MED ORDER — FLUTICASONE-SALMETEROL 230-21 MCG/ACT IN AERO: 2.0000 | INHALATION_SPRAY | Freq: Two times a day (BID) | RESPIRATORY_TRACT | 12 refills | Status: DC

## 2022-08-03 NOTE — Patient Instructions (Addendum)
Thank you for visiting Dr. Valeta Harms at [facility]. Today we recommend the following: Orders Placed This Encounter  Procedures   CBC w/Diff   REGIONAL PANEL 2   Meds ordered this encounter  Medications   predniSONE (DELTASONE) 10 MG tablet    Sig: Take 4 tabs by mouth once daily x4 days, then 3 tabs x4 days, 2 tabs x4 days, 1 tab x4 days and stop.    Dispense:  40 tablet    Refill:  0   fluticasone-salmeterol (ADVAIR HFA) 230-21 MCG/ACT inhaler    Sig: Inhale 2 puffs into the lungs 2 (two) times daily.    Dispense:  1 each    Refill:  12   umeclidinium bromide (INCRUSE ELLIPTA) 62.5 MCG/ACT AEPB    Sig: Inhale 1 puff into the lungs daily.    Dispense:  30 each    Refill:  2   Return in about 6 weeks (around 09/14/2022) for with APP.    Please do your part to reduce the spread of COVID-19.

## 2022-08-03 NOTE — Progress Notes (Signed)
Synopsis: Referred in January 2024 for self-referral, asthma by No ref. provider found  Subjective:   PATIENT ID: Angel [name] GENDER: female DOB: 04/04/1991, MRN: [id number]  Chief Complaint  Patient presents with   Follow-up    Asthma    This is a 32 year old female, past medical history of anxiety, asthma.  Prior surgical history, C-section with tubal ligation.She presents for evaluation of ongoing issues related to her asthma.  She was diagnosed with influenza on 07/08/2022. Currently on albuterol and advair 45 HFA.  She has had multiple exacerbations this year.  She been put on prednisone several times.  I also care for her mother who has atopic asthma on multiple medications and biologic injections.  Her husband still smokes in the home.  She used to smoke several years ago and quit.  She stopped when she was pregnant with her 2 twins.    Past Medical History:  Diagnosis Date   Anxiety    Asthma    Constipation      Family History  Problem Relation Age of Onset   Asthma Mother    Arthritis Mother    Diabetes Father    Hypertension Father    Cancer Maternal Grandmother    Heart attack Maternal Grandfather    Cancer Maternal Grandfather    Hypertension Paternal Grandfather      Past Surgical History:  Procedure Laterality Date   ADENOIDECTOMY     arm surgery     right   CESAREAN SECTION N/A 11/10/2015   Procedure: CESAREAN SECTION;  Surgeon: Dian Queen, MD;  Location: [place];  Service: Obstetrics;  Laterality: N/A;   CESAREAN SECTION WITH BILATERAL TUBAL LIGATION N/A 07/05/2018   Procedure: REPEAT CESAREAN SECTION WITH BILATERAL TUBAL LIGATION;  Surgeon: Tyson Dense, MD;  Location: [place];  Service: Obstetrics;  Laterality: N/A;  Repeat edc 07/12/18 allergy to cefzil, vancomycin, augmentin Tracey RNFA   HERNIA REPAIR     umbilical   TUBAL LIGATION     VENTRAL HERNIA REPAIR N/A 12/22/2018   Procedure: LAPAROSCOPIC  VENTRAL HERNIA REPAIR WITH MESH;  Surgeon: Armandina Gemma, MD;  Location: [facility];  Service: General;  Laterality: N/A;    Social History   Socioeconomic History   Marital status: Single    Spouse name: Not on file   Number of children: Not on file   Years of education: Not on file   Highest education level: Not on file  Occupational History   Not on file  Tobacco Use   Smoking status: Former    Packs/day: 1.00    Years: 3.00    Total pack years: 3.00    Types: Cigarettes   Smokeless tobacco: Never   Tobacco comments:    quit 2017  Vaping Use   Vaping Use: Never used  Substance and Sexual Activity   Alcohol use: Yes    Alcohol/week: 1.0 standard drink of alcohol    Types: 1 Glasses of wine per week    Comment: Rare   Drug use: No   Sexual activity: Yes    Birth control/protection: None  Other Topics Concern   Not on file  Social History Narrative   Not on file   Social Determinants of Health   Financial Resource Strain: Low Risk  (06/20/2018)   Overall Financial Resource Strain (CARDIA)    Difficulty of Paying Living Expenses: Not hard at all  Food Insecurity: No Food Insecurity (06/20/2018)   Hunger Vital Sign  Worried About Programme researcher, broadcasting/film/video in the Last Year: Never true    Ran Out of Food in the Last Year: Never true  Transportation Needs: Unknown (06/20/2018)   PRAPARE - Administrator, Civil Service (Medical): No    Lack of Transportation (Non-Medical): Not on file  Physical Activity: Not on file  Stress: Stress Concern Present (06/20/2018)   [employer] of Occupational Health - Occupational Stress Questionnaire    Feeling of Stress : To some extent  Social Connections: Not on file  Intimate Partner Violence: Not At Risk (06/20/2018)   Humiliation, Afraid, Rape, and Kick questionnaire    Fear of Current or Ex-Partner: No    Emotionally Abused: No    Physically Abused: No    Sexually Abused: No     Allergies  Allergen Reactions    Cefzil [Cefprozil] Shortness Of Breath and Swelling   Cefprozil Other (See Comments)    turned "blue"   Augmentin [Amoxicillin-Pot Clavulanate] Rash    Did it involve swelling of the face/tongue/throat, SOB, or low BP? No Did it involve sudden or severe rash/hives, skin peeling, or any reaction on the inside of your mouth or nose? Yes Did you need to seek medical attention at a hospital or doctor's office? Yes When did it last happen? 2012       If all above answers are "NO", may proceed with cephalosporin use.   Vancomycin Itching and Rash   Vancomycin Rash     Outpatient Medications Prior to Visit  Medication Sig Dispense Refill   albuterol (PROVENTIL HFA;VENTOLIN HFA) 108 (90 Base) MCG/ACT inhaler Inhale 2 puffs into the lungs every 6 (six) hours as needed for wheezing or shortness of breath.     acetaminophen (TYLENOL) 500 MG tablet Take 2 tablets (1,000 mg total) by mouth every 6 (six) hours as needed. (Patient not taking: Reported on 12/14/2018) 90 tablet 0   escitalopram (LEXAPRO) 20 MG tablet Take 20 mg by mouth daily.     fluticasone (FLOVENT HFA) 44 MCG/ACT inhaler Inhale 2 puffs into the lungs 2 (two) times daily as needed (cough). (Patient not taking: Reported on 08/03/2022)     HYDROcodone-acetaminophen (NORCO/VICODIN) 5-325 MG tablet Take 1-2 tablets by mouth every 4 (four) hours as needed for moderate pain. 30 tablet 0   ibuprofen (ADVIL,MOTRIN) 600 MG tablet Take 1 tablet (600 mg total) by mouth every 6 (six) hours as needed. (Patient not taking: Reported on 12/14/2018) 90 tablet 0   LORazepam (ATIVAN) 0.5 MG tablet Take 0.5 mg by mouth 2 (two) times daily as needed for anxiety.     polyethylene glycol (MIRALAX / GLYCOLAX) packet Take 17 g by mouth daily as needed for mild constipation.      progesterone (PROMETRIUM) 200 MG capsule Take 1 capsule every day by mouth at bedtime for 10 days 10 capsule 0   trimethoprim-polymyxin b (POLYTRIM) ophthalmic solution Place 1 drop into the  left eye 6 (six) times daily for 5 days 10 mL 0   ADVAIR HFA 45-21 MCG/ACT inhaler Inhale 2 puffs into the lungs 2 (two) times daily.     No facility-administered medications prior to visit.    Review of Systems  Constitutional:  Negative for chills, fever, malaise/fatigue and weight loss.  HENT:  Negative for hearing loss, sore throat and tinnitus.   Eyes:  Negative for blurred vision and double vision.  Respiratory:  Positive for cough, sputum production, shortness of breath and wheezing. Negative for hemoptysis and  stridor.   Cardiovascular:  Negative for chest pain, palpitations, orthopnea, leg swelling and PND.  Gastrointestinal:  Negative for abdominal pain, constipation, diarrhea, heartburn, nausea and vomiting.  Genitourinary:  Negative for dysuria, hematuria and urgency.  Musculoskeletal:  Negative for joint pain and myalgias.  Skin:  Negative for itching and rash.  Neurological:  Negative for dizziness, tingling, weakness and headaches.  Endo/Heme/Allergies:  Negative for environmental allergies. Does not bruise/bleed easily.  Psychiatric/Behavioral:  Negative for depression. The patient is not nervous/anxious and does not have insomnia.   All other systems reviewed and are negative.    Objective:  Physical Exam Vitals reviewed.  Constitutional:      General: She is not in acute distress.    Appearance: She is well-developed. She is obese.  HENT:     Head: Normocephalic and atraumatic.  Eyes:     General: No scleral icterus.    Conjunctiva/sclera: Conjunctivae normal.     Pupils: Pupils are equal, round, and reactive to light.  Neck:     Vascular: No JVD.     Trachea: No tracheal deviation.  Cardiovascular:     Rate and Rhythm: Normal rate and regular rhythm.     Heart sounds: Normal heart sounds. No murmur heard. Pulmonary:     Effort: Pulmonary effort is normal. No tachypnea, accessory muscle usage or respiratory distress.     Breath sounds: No stridor. No  wheezing, rhonchi or rales.  Abdominal:     General: There is no distension.     Palpations: Abdomen is soft.     Tenderness: There is no abdominal tenderness.  Musculoskeletal:        General: No tenderness.     Cervical back: Neck supple.  Lymphadenopathy:     Cervical: No cervical adenopathy.  Skin:    General: Skin is warm and dry.     Capillary Refill: Capillary refill takes less than 2 seconds.     Findings: No rash.  Neurological:     Mental Status: She is alert and oriented to person, place, and time.  Psychiatric:        Behavior: Behavior normal.      Vitals:   08/03/22 1009  BP: 110/70  Pulse: 71  SpO2: 96%  Weight: 213 lb 10.1 oz (96.9 kg)  Height: 5\' 5"  (1.651 m)   96% on RA BMI Readings from Last 3 Encounters:  08/03/22 35.55 kg/m  12/22/18 36.28 kg/m  12/19/18 36.28 kg/m   Wt Readings from Last 3 Encounters:  08/03/22 213 lb 10.1 oz (96.9 kg)  12/22/18 218 lb (98.9 kg)  12/19/18 218 lb (98.9 kg)     CBC    Component Value Date/Time   WBC 8.2 12/19/2018 1058   RBC 5.09 12/19/2018 1058   HGB 13.4 12/19/2018 1058   HCT 43.6 12/19/2018 1058   PLT 275 12/19/2018 1058   MCV 85.7 12/19/2018 1058   MCH 26.3 12/19/2018 1058   MCHC 30.7 12/19/2018 1058   RDW 12.8 12/19/2018 1058   LYMPHSABS 2.7 02/26/2011 1422   MONOABS 0.7 02/26/2011 1422   EOSABS 0.5 02/26/2011 1422   BASOSABS 0.0 02/26/2011 1422     Chest Imaging: No recent chest imaging  Pulmonary Functions Testing Results:     No data to display          FeNO:   Pathology:   Echocardiogram:   Heart Catheterization:     Assessment & Plan:     ICD-10-CM   1. Moderate persistent  asthma without complication  F57.32 CBC w/Diff    REGIONAL PANEL 2    2. History of influenza  Z87.09     3. 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  She has only used low-dose Advair plus.  Albuterol in the  past.  She is a former smoker quit several years ago when she was pregnant with her children.  She does live in a home with secondhand smoke exposure from her husband.  She usually gets recurrent exacerbations with chest tightness and wheezing.  Worse at night.  Plan: Encouraged her to talk to her husband about potentially him smoking outside or at least not around her. She does think that this stimulates her respiratory problems. We are going to step up her Advair to 230, twice daily 2 puffs. Also add Incruse. Both of these medications appear to be covered by her formulary. Will need to work with her insurance to find an appropriate ICS/LABA/LAMA combination. Alternatively we could always try a triple therapy inhaler and try to obtain prior authorization. Hopefully she will breathe better on the new regimen. Due to her ongoing active symptoms and exacerbation today Emina started on a prednisone taper. Were going to check blood work today to look for eosinophil counts, IgE level and RAST panel. Return to clinic in 6 weeks to review labs and see how her new inhaler regimen is working.   Current Outpatient Medications:    albuterol (PROVENTIL HFA;VENTOLIN HFA) 108 (90 Base) MCG/ACT inhaler, Inhale 2 puffs into the lungs every 6 (six) hours as needed for wheezing or shortness of breath., Disp: , Rfl:    fluticasone-salmeterol (ADVAIR HFA) 230-21 MCG/ACT inhaler, Inhale 2 puffs into the lungs 2 (two) times daily., Disp: 1 each, Rfl: 12   predniSONE (DELTASONE) 10 MG tablet, Take 4 tabs by mouth once daily x4 days, then 3 tabs x4 days, 2 tabs x4 days, 1 tab x4 days and stop., Disp: 40 tablet, Rfl: 0   umeclidinium bromide (INCRUSE ELLIPTA) 62.5 MCG/ACT AEPB, Inhale 1 puff into the lungs daily., Disp: 30 each, Rfl: 2   acetaminophen (TYLENOL) 500 MG tablet, Take 2 tablets (1,000 mg total) by mouth every 6 (six) hours as needed. (Patient not taking: Reported on 12/14/2018), Disp: 90 tablet, Rfl: 0    escitalopram (LEXAPRO) 20 MG tablet, Take 20 mg by mouth daily., Disp: , Rfl:    fluticasone (FLOVENT HFA) 44 MCG/ACT inhaler, Inhale 2 puffs into the lungs 2 (two) times daily as needed (cough). (Patient not taking: Reported on 08/03/2022), Disp: , Rfl:    HYDROcodone-acetaminophen (NORCO/VICODIN) 5-325 MG tablet, Take 1-2 tablets by mouth every 4 (four) hours as needed for moderate pain., Disp: 30 tablet, Rfl: 0   ibuprofen (ADVIL,MOTRIN) 600 MG tablet, Take 1 tablet (600 mg total) by mouth every 6 (six) hours as needed. (Patient not taking: Reported on 12/14/2018), Disp: 90 tablet, Rfl: 0   LORazepam (ATIVAN) 0.5 MG tablet, Take 0.5 mg by mouth 2 (two) times daily as needed for anxiety., Disp: , Rfl:    polyethylene glycol (MIRALAX / GLYCOLAX) packet, Take 17 g by mouth daily as needed for mild constipation. , Disp: , Rfl:    progesterone (PROMETRIUM) 200 MG capsule, Take 1 capsule every day by mouth at bedtime for 10 days, Disp: 10 capsule, Rfl: 0   trimethoprim-polymyxin b (POLYTRIM) ophthalmic solution, Place 1 drop into the left eye 6 (six) times  daily for 5 days, Disp: 10 mL, Rfl: 0    Josephine Igo, DO [facility] 08/03/2022 10:48 AM

## 2022-08-04 LAB — IGE: IgE (Immunoglobulin E), Serum: 51 kU/L (ref <=114)

## 2022-08-06 NOTE — Progress Notes (Signed)
Tay,  Please let patient know I reviewed her lab work.  It is consistent with atopic asthma.  She does have elevated serum eosinophils.  Continue current asthma regimen.  If she continues to have breakthrough symptoms we can consider biologic injections in the future.  Thanks,  BLI  Garner Nash, DO [facility] 08/06/2022 3:40 PM

## 2022-08-07 LAB — REGIONAL PANEL 2
012-IgE Goldenrod: <0.1 kU/L
Amer Sycamore IgE Qn: <0.1 kU/L
Bahia Grass IgE: <0.1 kU/L
Bermuda Grass IgE: <0.1 kU/L
Cocklebur IgE: <0.1 kU/L
Cottonwood IgE: <0.1 kU/L
Elm, American IgE: <0.1 kU/L
Hickory, White IgE: <0.1 kU/L
Johnson Grass IgE: <0.1 kU/L
Kentucky Bluegrass IgE: <0.1 kU/L
Lamb's Quarters IgE: <0.1 kU/L
Maple/Box Elder IgE: <0.1 kU/L
Oak, White IgE: <0.1 kU/L
Pigweed, Rough IgE: <0.1 kU/L
Plantain, English IgE: <0.1 kU/L
Ragweed, Short IgE: <0.1 kU/L
T005-IgE Beech (American): <0.1 kU/L
T010-IgE Walnut: <0.1 kU/L
T012-IgE Willow: <0.1 kU/L
T015-IgE Ash, White: <0.1 kU/L
W004-IgE Ragweed, False: <0.1 kU/L

## 2022-08-21 NOTE — Progress Notes (Signed)
We will review with patient at upcoming office visit.   Thanks,  BLI  Garner Nash, DO [facility] 08/21/2022 9:48 AM

## 2022-09-13 NOTE — Progress Notes (Unsigned)
Synopsis: Referred in January 2024 for self-referral, asthma by No ref. provider found  Subjective:   PATIENT ID: Angel [name] GENDER: female DOB: 27-Jul-1990, MRN: [id number]  No chief complaint on file.   This is a 32 year old female, past medical history of anxiety, asthma.  Prior surgical history, C-section with tubal ligation.She presents for evaluation of ongoing issues related to her asthma.  She was diagnosed with influenza on 07/08/2022. Currently on albuterol and advair 45 HFA.  She has had multiple exacerbations this year.  She been put on prednisone several times.  I also care for her mother who has atopic asthma on multiple medications and biologic injections.  Her husband still smokes in the home.  She used to smoke several years ago and quit.  She stopped when she was pregnant with her 2 twins.    Past Medical History:  Diagnosis Date   Anxiety    Asthma    Constipation      Family History  Problem Relation Age of Onset   Asthma Mother    Arthritis Mother    Diabetes Father    Hypertension Father    Cancer Maternal Grandmother    Heart attack Maternal Grandfather    Cancer Maternal Grandfather    Hypertension Paternal Grandfather      Past Surgical History:  Procedure Laterality Date   ADENOIDECTOMY     arm surgery     right   CESAREAN SECTION N/A 11/10/2015   Procedure: CESAREAN SECTION;  Surgeon: Dian Queen, MD;  Location: [facility];  Service: Obstetrics;  Laterality: N/A;   CESAREAN SECTION WITH BILATERAL TUBAL LIGATION N/A 07/05/2018   Procedure: REPEAT CESAREAN SECTION WITH BILATERAL TUBAL LIGATION;  Surgeon: Tyson Dense, MD;  Location: [facility];  Service: Obstetrics;  Laterality: N/A;  Repeat edc 07/12/18 allergy to cefzil, vancomycin, augmentin Tracey RNFA   HERNIA REPAIR     umbilical   TUBAL LIGATION     VENTRAL HERNIA REPAIR N/A 12/22/2018   Procedure: LAPAROSCOPIC VENTRAL HERNIA REPAIR WITH MESH;   Surgeon: Armandina Gemma, MD;  Location: [facility];  Service: General;  Laterality: N/A;    Social History   Socioeconomic History   Marital status: Married    Spouse name: Not on file   Number of children: Not on file   Years of education: Not on file   Highest education level: Not on file  Occupational History   Not on file  Tobacco Use   Smoking status: Former    Packs/day: 1.00    Years: 3.00    Total pack years: 3.00    Types: Cigarettes   Smokeless tobacco: Never   Tobacco comments:    quit 2017  Vaping Use   Vaping Use: Never used  Substance and Sexual Activity   Alcohol use: Yes    Alcohol/week: 1.0 standard drink of alcohol    Types: 1 Glasses of wine per week    Comment: Rare   Drug use: No   Sexual activity: Yes    Birth control/protection: None  Other Topics Concern   Not on file  Social History Narrative   Not on file   Social Determinants of Health   Financial Resource Strain: Low Risk  (06/20/2018)   Overall Financial Resource Strain (CARDIA)    Difficulty of Paying Living Expenses: Not hard at all  Food Insecurity: No Food Insecurity (06/20/2018)   Hunger Vital Sign    Worried About Running Out of Food in the  Last Year: Never true    [place] in the Last Year: Never true  Transportation Needs: Unknown (06/20/2018)   PRAPARE - Hydrologist (Medical): No    Lack of Transportation (Non-Medical): Not on file  Physical Activity: Not on file  Stress: Stress Concern Present (06/20/2018)   [place]    Feeling of Stress : To some extent  Social Connections: Not on file  Intimate Partner Violence: Not At Risk (06/20/2018)   Humiliation, Afraid, Rape, and Kick questionnaire    Fear of Current or Ex-Partner: No    Emotionally Abused: No    Physically Abused: No    Sexually Abused: No     Allergies  Allergen Reactions   Cefzil [Cefprozil] Shortness Of  Breath and Swelling   Cefprozil Other (See Comments)    turned "blue"   Augmentin [Amoxicillin-Pot Clavulanate] Rash    Did it involve swelling of the face/tongue/throat, SOB, or low BP? No Did it involve sudden or severe rash/hives, skin peeling, or any reaction on the inside of your mouth or nose? Yes Did you need to seek medical attention at a hospital or doctor's office? Yes When did it last happen? 2012       If all above answers are "NO", may proceed with cephalosporin use.   Vancomycin Itching and Rash   Vancomycin Rash     Outpatient Medications Prior to Visit  Medication Sig Dispense Refill   acetaminophen (TYLENOL) 500 MG tablet Take 2 tablets (1,000 mg total) by mouth every 6 (six) hours as needed. (Patient not taking: Reported on 12/14/2018) 90 tablet 0   albuterol (PROVENTIL HFA;VENTOLIN HFA) 108 (90 Base) MCG/ACT inhaler Inhale 2 puffs into the lungs every 6 (six) hours as needed for wheezing or shortness of breath.     escitalopram (LEXAPRO) 20 MG tablet Take 20 mg by mouth daily.     fluticasone (FLOVENT HFA) 44 MCG/ACT inhaler Inhale 2 puffs into the lungs 2 (two) times daily as needed (cough). (Patient not taking: Reported on 08/03/2022)     fluticasone-salmeterol (ADVAIR HFA) 230-21 MCG/ACT inhaler Inhale 2 puffs into the lungs 2 (two) times daily. 1 each 12   HYDROcodone-acetaminophen (NORCO/VICODIN) 5-325 MG tablet Take 1-2 tablets by mouth every 4 (four) hours as needed for moderate pain. 30 tablet 0   ibuprofen (ADVIL,MOTRIN) 600 MG tablet Take 1 tablet (600 mg total) by mouth every 6 (six) hours as needed. (Patient not taking: Reported on 12/14/2018) 90 tablet 0   LORazepam (ATIVAN) 0.5 MG tablet Take 0.5 mg by mouth 2 (two) times daily as needed for anxiety.     polyethylene glycol (MIRALAX / GLYCOLAX) packet Take 17 g by mouth daily as needed for mild constipation.      predniSONE (DELTASONE) 10 MG tablet Take 4 tabs by mouth once daily x4 days, then 3 tabs x4 days, 2  tabs x4 days, 1 tab x4 days and stop. 40 tablet 0   progesterone (PROMETRIUM) 200 MG capsule Take 1 capsule every day by mouth at bedtime for 10 days 10 capsule 0   trimethoprim-polymyxin b (POLYTRIM) ophthalmic solution Place 1 drop into the left eye 6 (six) times daily for 5 days 10 mL 0   umeclidinium bromide (INCRUSE ELLIPTA) 62.5 MCG/ACT AEPB Inhale 1 puff into the lungs daily. 30 each 2   No facility-administered medications prior to visit.    Review of Systems  Constitutional:  Negative for chills, fever, malaise/fatigue and weight loss.  HENT:  Negative for hearing loss, sore throat and tinnitus.   Eyes:  Negative for blurred vision and double vision.  Respiratory:  Positive for cough, sputum production, shortness of breath and wheezing. Negative for hemoptysis and stridor.   Cardiovascular:  Negative for chest pain, palpitations, orthopnea, leg swelling and PND.  Gastrointestinal:  Negative for abdominal pain, constipation, diarrhea, heartburn, nausea and vomiting.  Genitourinary:  Negative for dysuria, hematuria and urgency.  Musculoskeletal:  Negative for joint pain and myalgias.  Skin:  Negative for itching and rash.  Neurological:  Negative for dizziness, tingling, weakness and headaches.  Endo/Heme/Allergies:  Negative for environmental allergies. Does not bruise/bleed easily.  Psychiatric/Behavioral:  Negative for depression. The patient is not nervous/anxious and does not have insomnia.   All other systems reviewed and are negative.    Objective:  Physical Exam Vitals reviewed.  Constitutional:      General: She is not in acute distress.    Appearance: She is well-developed. She is obese.  HENT:     Head: Normocephalic and atraumatic.  Eyes:     General: No scleral icterus.    Conjunctiva/sclera: Conjunctivae normal.     Pupils: Pupils are equal, round, and reactive to light.  Neck:     Vascular: No JVD.     Trachea: No tracheal deviation.  Cardiovascular:      Rate and Rhythm: Normal rate and regular rhythm.     Heart sounds: Normal heart sounds. No murmur heard. Pulmonary:     Effort: Pulmonary effort is normal. No tachypnea, accessory muscle usage or respiratory distress.     Breath sounds: No stridor. No wheezing, rhonchi or rales.  Abdominal:     General: There is no distension.     Palpations: Abdomen is soft.     Tenderness: There is no abdominal tenderness.  Musculoskeletal:        General: No tenderness.     Cervical back: Neck supple.  Lymphadenopathy:     Cervical: No cervical adenopathy.  Skin:    General: Skin is warm and dry.     Capillary Refill: Capillary refill takes less than 2 seconds.     Findings: No rash.  Neurological:     Mental Status: She is alert and oriented to person, place, and time.  Psychiatric:        Behavior: Behavior normal.      There were no vitals filed for this visit.    on RA BMI Readings from Last 3 Encounters:  08/03/22 35.55 kg/m  12/22/18 36.28 kg/m  12/19/18 36.28 kg/m   Wt Readings from Last 3 Encounters:  08/03/22 213 lb 10.1 oz (96.9 kg)  12/22/18 218 lb (98.9 kg)  12/19/18 218 lb (98.9 kg)     CBC    Component Value Date/Time   WBC 9.4 08/03/2022 1103   RBC 4.98 08/03/2022 1103   HGB 14.5 08/03/2022 1103   HCT 42.6 08/03/2022 1103   PLT 286.0 08/03/2022 1103   MCV 85.6 08/03/2022 1103   MCH 26.3 12/19/2018 1058   MCHC 34.0 08/03/2022 1103   RDW 14.4 08/03/2022 1103   LYMPHSABS 2.4 08/03/2022 1103   MONOABS 0.9 08/03/2022 1103   EOSABS 1.4 (H) 08/03/2022 1103   BASOSABS 0.1 08/03/2022 1103     Chest Imaging: No recent chest imaging  Pulmonary Functions Testing Results:     No data to display  FeNO:   Pathology:   Echocardiogram:   Heart Catheterization:     Assessment & Plan:   No diagnosis found.   Discussion:  This is a 32 year old female, moderate persistent asthma.  She has only used low-dose Advair plus.  Albuterol in the  past.  She is a former smoker quit several years ago when she was pregnant with her children.  She does live in a home with secondhand smoke exposure from her husband.  She usually gets recurrent exacerbations with chest tightness and wheezing.  Worse at night.  Plan: Encouraged her to talk to her husband about potentially him smoking outside or at least not around her. She does think that this stimulates her respiratory problems. We are going to step up her Advair to 230, twice daily 2 puffs. Also add Incruse. Both of these medications appear to be covered by her formulary. Will need to work with her insurance to find an appropriate ICS/LABA/LAMA combination. Alternatively we could always try a triple therapy inhaler and try to obtain prior authorization. Hopefully she will breathe better on the new regimen. Due to her ongoing active symptoms and exacerbation today Emina started on a prednisone taper. Were going to check blood work today to look for eosinophil counts, IgE level and RAST panel. Return to clinic in 6 weeks to review labs and see how her new inhaler regimen is working.   Current Outpatient Medications:    acetaminophen (TYLENOL) 500 MG tablet, Take 2 tablets (1,000 mg total) by mouth every 6 (six) hours as needed. (Patient not taking: Reported on 12/14/2018), Disp: 90 tablet, Rfl: 0   albuterol (PROVENTIL HFA;VENTOLIN HFA) 108 (90 Base) MCG/ACT inhaler, Inhale 2 puffs into the lungs every 6 (six) hours as needed for wheezing or shortness of breath., Disp: , Rfl:    escitalopram (LEXAPRO) 20 MG tablet, Take 20 mg by mouth daily., Disp: , Rfl:    fluticasone (FLOVENT HFA) 44 MCG/ACT inhaler, Inhale 2 puffs into the lungs 2 (two) times daily as needed (cough). (Patient not taking: Reported on 08/03/2022), Disp: , Rfl:    fluticasone-salmeterol (ADVAIR HFA) 230-21 MCG/ACT inhaler, Inhale 2 puffs into the lungs 2 (two) times daily., Disp: 1 each, Rfl: 12   HYDROcodone-acetaminophen  (NORCO/VICODIN) 5-325 MG tablet, Take 1-2 tablets by mouth every 4 (four) hours as needed for moderate pain., Disp: 30 tablet, Rfl: 0   ibuprofen (ADVIL,MOTRIN) 600 MG tablet, Take 1 tablet (600 mg total) by mouth every 6 (six) hours as needed. (Patient not taking: Reported on 12/14/2018), Disp: 90 tablet, Rfl: 0   LORazepam (ATIVAN) 0.5 MG tablet, Take 0.5 mg by mouth 2 (two) times daily as needed for anxiety., Disp: , Rfl:    polyethylene glycol (MIRALAX / GLYCOLAX) packet, Take 17 g by mouth daily as needed for mild constipation. , Disp: , Rfl:    predniSONE (DELTASONE) 10 MG tablet, Take 4 tabs by mouth once daily x4 days, then 3 tabs x4 days, 2 tabs x4 days, 1 tab x4 days and stop., Disp: 40 tablet, Rfl: 0   progesterone (PROMETRIUM) 200 MG capsule, Take 1 capsule every day by mouth at bedtime for 10 days, Disp: 10 capsule, Rfl: 0   trimethoprim-polymyxin b (POLYTRIM) ophthalmic solution, Place 1 drop into the left eye 6 (six) times daily for 5 days, Disp: 10 mL, Rfl: 0   umeclidinium bromide (INCRUSE ELLIPTA) 62.5 MCG/ACT AEPB, Inhale 1 puff into the lungs daily., Disp: 30 each, Rfl: 2    Perry Molla L  Valeta Harms DO [facility] 09/13/2022 8:14 PM

## 2022-09-14 ENCOUNTER — Telehealth: Payer: Self-pay | Admitting: Pharmacist

## 2022-09-14 ENCOUNTER — Ambulatory Visit: Payer: Medicaid Other | Admitting: Pulmonary Disease

## 2022-09-14 ENCOUNTER — Encounter: Payer: Self-pay | Admitting: Pulmonary Disease

## 2022-09-14 VITALS — BP 120/80 | HR 75 | Ht 65.0 in | Wt 209.2 lb

## 2022-09-14 DIAGNOSIS — J4550 Severe persistent asthma, uncomplicated: Secondary | ICD-10-CM

## 2022-09-14 DIAGNOSIS — D721 Eosinophilia, unspecified: Secondary | ICD-10-CM

## 2022-09-14 MED ORDER — MONTELUKAST SODIUM 10 MG PO TABS: 10.0000 mg | ORAL_TABLET | Freq: Every day | ORAL | 11 refills | Status: DC

## 2022-09-14 NOTE — Telephone Encounter (Signed)
Received message via Epic chat from Dr. Valeta Harms that patient will be new start to [facility].  Paperwork should be getting completed at [facility] today  Please start Tezspire BIV  Dose: '210mg'$  SQ every 4 weeks  Knox Saliva, PharmD, MPH, BCPS, CPP Clinical Pharmacist (Rheumatology and Pulmonology)

## 2022-09-14 NOTE — Patient Instructions (Addendum)
Thank you for visiting Dr. Valeta Harms at [facility]. Today we recommend the following:  Start singulair  Start tespire application and approval process  Continue current inhaler regimen   Return in about 3 months (around 12/15/2022) for with APP. For biologic check in     Please do your part to reduce the spread of COVID-19.

## 2022-09-14 NOTE — Telephone Encounter (Signed)
Submitted a Prior Authorization request to  [facility]  for TEZSPIRE via CoverMyMeds. Will update once we receive a response.  Key: [id number]  Knox Saliva, PharmD, MPH, BCPS, CPP Clinical Pharmacist (Rheumatology and Pulmonology)

## 2022-09-15 NOTE — Telephone Encounter (Signed)
Tezspire Together enrollment form received. Sent to scan center as she has Medicaid and will not qualify for copay card  Knox Saliva, PharmD, MPH, BCPS, CPP Clinical Pharmacist (Rheumatology and Pulmonology)

## 2022-09-16 NOTE — Telephone Encounter (Signed)
Received notification from  [place] [employer]  regarding a prior authorization for TEZSPIRE. Authorization has been APPROVED from 09/14/22 to 04/02/23. Approval letter sent to scan center.  Per test claim, copay for 28 days supply is $4  Patient can fill through [place]: [phone]   Authorization # [id number]  ATC patient to schedule Tezspire new start. Unable to reach. Left VM requesting return call  Knox Saliva, PharmD, MPH, BCPS, CPP Clinical Pharmacist (Rheumatology and Pulmonology)

## 2022-09-18 NOTE — Telephone Encounter (Signed)
Patient is scheduled for Tezspire new start on 09/22/22  Knox Saliva, PharmD, MPH, BCPS, CPP Clinical Pharmacist (Rheumatology and Pulmonology)

## 2022-09-22 ENCOUNTER — Other Ambulatory Visit (HOSPITAL_COMMUNITY): Payer: Self-pay

## 2022-09-22 ENCOUNTER — Ambulatory Visit: Payer: Medicaid Other | Admitting: Pharmacist

## 2022-09-22 ENCOUNTER — Other Ambulatory Visit: Payer: Self-pay

## 2022-09-22 DIAGNOSIS — J4550 Severe persistent asthma, uncomplicated: Secondary | ICD-10-CM

## 2022-09-22 DIAGNOSIS — Z7189 Other specified counseling: Secondary | ICD-10-CM

## 2022-09-22 MED ORDER — TEZSPIRE 210 MG/1.91ML ~~LOC~~ SOAJ
210.0000 mg | SUBCUTANEOUS | 5 refills | Status: DC
  Filled 2022-09-22: qty 1.91, fill #0
  Filled 2022-10-07: qty 5.73, 84d supply, fill #0
  Filled 2022-12-31: qty 5.73, 84d supply, fill #1

## 2022-09-22 NOTE — Progress Notes (Signed)
HPI Patient presents today to [facility] to see pharmacy team for [facility] new start.  She has severe persistent asthma, allergic rhinitis  Her mother is also on Tezspire and is doing well  Respiratory Medications Current regimen: Advair HFA 230-21 (2 puffs twice daily), Incruse 25mcg (1 puff once daily), montelukast 10mg  nightly Patient reports no known adherence challenges  OBJECTIVE Allergies  Allergen Reactions   Cefzil [Cefprozil] Shortness Of Breath and Swelling   Cefprozil Other (See Comments)    turned "blue"   Augmentin [Amoxicillin-Pot Clavulanate] Rash    Did it involve swelling of the face/tongue/throat, SOB, or low BP? No Did it involve sudden or severe rash/hives, skin peeling, or any reaction on the inside of your mouth or nose? Yes Did you need to seek medical attention at a hospital or doctor's office? Yes When did it last happen? 2012       If all above answers are "NO", may proceed with cephalosporin use.   Vancomycin Itching and Rash   Vancomycin Rash    Outpatient Encounter Medications as of 09/22/2022  Medication Sig   acetaminophen (TYLENOL) 500 MG tablet Take 2 tablets (1,000 mg total) by mouth every 6 (six) hours as needed. (Patient not taking: Reported on 12/14/2018)   albuterol (PROVENTIL HFA;VENTOLIN HFA) 108 (90 Base) MCG/ACT inhaler Inhale 2 puffs into the lungs every 6 (six) hours as needed for wheezing or shortness of breath.   buPROPion (WELLBUTRIN SR) 150 MG 12 hr tablet Take 150 mg by mouth daily.   citalopram (CELEXA) 20 MG tablet Take 20 mg by mouth daily.   escitalopram (LEXAPRO) 20 MG tablet Take 20 mg by mouth daily. (Patient not taking: Reported on 09/14/2022)   fluticasone (FLOVENT HFA) 44 MCG/ACT inhaler Inhale 2 puffs into the lungs 2 (two) times daily as needed (cough).   fluticasone-salmeterol (ADVAIR HFA) 230-21 MCG/ACT inhaler Inhale 2 puffs into the lungs 2 (two) times daily.   HYDROcodone-acetaminophen (NORCO/VICODIN) 5-325  MG tablet Take 1-2 tablets by mouth every 4 (four) hours as needed for moderate pain.   ibuprofen (ADVIL,MOTRIN) 600 MG tablet Take 1 tablet (600 mg total) by mouth every 6 (six) hours as needed.   LORazepam (ATIVAN) 0.5 MG tablet Take 0.5 mg by mouth 2 (two) times daily as needed for anxiety.   montelukast (SINGULAIR) 10 MG tablet Take 1 tablet (10 mg total) by mouth at bedtime.   polyethylene glycol (MIRALAX / GLYCOLAX) packet Take 17 g by mouth daily as needed for mild constipation.    predniSONE (DELTASONE) 10 MG tablet Take 4 tabs by mouth once daily x4 days, then 3 tabs x4 days, 2 tabs x4 days, 1 tab x4 days and stop. (Patient not taking: Reported on 09/14/2022)   progesterone (PROMETRIUM) 200 MG capsule Take 1 capsule every day by mouth at bedtime for 10 days (Patient not taking: Reported on 09/14/2022)   trimethoprim-polymyxin b (POLYTRIM) ophthalmic solution Place 1 drop into the left eye 6 (six) times daily for 5 days (Patient not taking: Reported on 09/14/2022)   umeclidinium bromide (INCRUSE ELLIPTA) 62.5 MCG/ACT AEPB Inhale 1 puff into the lungs daily.   No facility-administered encounter medications on file as of 09/22/2022.     Immunization History  Administered Date(s) Administered   DTaP 11/20/1991, 01/19/1992, 03/20/1992, 12/26/1992, 09/19/1994   HIB (PRP-T) 11/20/1991, 01/19/1992, 03/20/1992, 12/26/1992   HPV Quadrivalent 10/10/2007, 12/20/2007, 05/08/2008   Hepatitis A, Ped/Adol-2 Dose 03/22/2006, 04/20/2007   Hepatitis B, PED/ADOLESCENT 10/18/1991, 11/20/1991, 03/20/1992   IPV  11/20/1991, 01/19/1992, 12/26/1992, 09/19/1994   Influenza Split 04/20/2007, 07/29/2009, 05/18/2013   Influenza,inj,Quad PF,6+ Mos 06/05/2020, 05/22/2021, 04/01/2022   Influenza-Unspecified 05/16/2015, 04/05/2021   MMR 12/26/1992, 09/19/1994   Meningococcal Conjugate 03/22/2006   Pneumococcal Polysaccharide-23 05/22/2021   Td (Adult),5 Lf Tetanus Toxid, Preservative Free 04/03/2002, 01/10/2007    Tdap 07/29/2009, 03/04/2018   Varicella 09/29/1994, 03/22/2006     PFTs     No data to display           Eosinophils Most recent blood eosinophil count was 1300 cells/microL taken on 09/18/22.   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 This blocks TSLP from its effect on inflammation including reduce eosinophils, IgE, FeNO, IL-5, and IL-13. Mechanism is not definitively established. Reviewed that Cheron Every is add-on medication and patient must continue maintenance inhaler regimen. Response to therapy: may take 3-4 months to determine efficacy. She is not actively family planning - has had tubal ligation. We discussed in detail that Cheron Every is not studied during family planning and pregnancy and she would need to notify if any changes to family planning  Side effects: injection site reaction (6-18%), antibody development (2%), arthralgia (4%), back pain (4%), pharyngitis (4%)  Dose: Tezspire 210 mg once every 4 weeks  Administration/Storage:  Reviewed administration sites of thigh or abdomen (at least 2-3 inches away from abdomen). Reviewed the upper arm is only appropriate if caregiver is administering injection  Do not shake pen/syringe as this could lead to product foaming or precipitation. Do not shake syringe as this could lead to product foaming or precipitation.  Access: Approval of Tezspire through: insurance  Patient self-administered Tezspire 210mg /1.91 ml in left upper thigh using sample Tezspire 210mg /1.91 ml Autoinjector pen NDC: [phone] Lot: [id number] Expiration: 06/04/2024  Patient monitored for 30 minutes for adverse reaction.  Patient tolerated without issue. Reported some pain with injection but reports confidence with . Injection site checked and no redness or swelling noted. Patient denies itchiness and irritation  Medication  Reconciliation  A drug regimen assessment was performed, including review of allergies, interactions, disease-state management, dosing and immunization history. Medications were reviewed with the patient, including name, instructions, indication, goals of therapy, potential side effects, importance of adherence, and safe use.  Drug interaction(s): none noted  She is not actively family planning - has had tubal ligation. We discussed in detail that Cheron Every is not studied during family planning and pregnancy and she would need to notify if any changes to family planning  PLAN Continue Tezspire 210mg  SQ every 28 days.  Rx sent to: [facility]: [phone] .   Continue maintenance asthma regimen of: Advair HFA 230-21 (2 puffs twice daily), Incruse 76mcg (1 puff once daily), montelukast 10mg  nightly  All questions encouraged and answered.  Instructed patient to reach out with any further questions or concerns.  Thank you for allowing pharmacy to participate in this patient's care.  This appointment required 45 minutes of patient care (this includes precharting, chart review, review of results, face-to-face care, etc.).   Knox Saliva, PharmD, MPH, BCPS, CPP Clinical Pharmacist (Rheumatology and Pulmonology)

## 2022-09-22 NOTE — Patient Instructions (Addendum)
Your next TEZSPIRE dose is due on 10/20/22, 11/17/22, and every 4 weeks thereafter  CONTINUE Advair HFA 230-21 (2 puffs twice daily), Incruse 98mcg (1 puff once daily), montelukast 10mg  nightly  Your prescription will be shipped from [facility]. Their phone number is [phone]. [facility] will call you to set up the first shipment. After that, the pharmacy will call you month to month to send to your home. Please call to schedule shipment and confirm address. They will mail your medication to your home.  You will need to be seen by your provider in 3 to 4 months to assess how TEZSPIRE is working for you. Please ensure you have a follow-up appointment scheduled in June or July 2024. Call our clinic if you need to make this appointment.  How to manage an injection site reaction: Remember the 5 C's: COUNTER - leave on the counter at least 30 minutes but up to overnight to bring medication to room temperature. This may help prevent stinging COLD - place something cold (like an ice gel pack or cold water bottle) on the injection site just before cleansing with alcohol. This may help reduce pain CLARITIN - use Claritin (generic name is loratadine) for the first two weeks of treatment or the day of, the day before, and the day after injecting. This will help to minimize injection site reactions CORTISONE CREAM - apply if injection site is irritated and itching CALL ME - if injection site reaction is bigger than the size of your fist, looks infected, blisters, or if you develop hives

## 2022-09-24 ENCOUNTER — Other Ambulatory Visit (HOSPITAL_COMMUNITY): Payer: Self-pay

## 2022-09-27 ENCOUNTER — Encounter (HOSPITAL_BASED_OUTPATIENT_CLINIC_OR_DEPARTMENT_OTHER): Payer: Self-pay | Admitting: Emergency Medicine

## 2022-09-27 ENCOUNTER — Other Ambulatory Visit: Payer: Self-pay

## 2022-09-27 ENCOUNTER — Emergency Department (HOSPITAL_BASED_OUTPATIENT_CLINIC_OR_DEPARTMENT_OTHER): Payer: Medicaid Other

## 2022-09-27 ENCOUNTER — Emergency Department (HOSPITAL_BASED_OUTPATIENT_CLINIC_OR_DEPARTMENT_OTHER)
Admission: EM | Admit: 2022-09-27 | Discharge: 2022-09-27 | Disposition: A | Discharge status: Home / Self Care | Payer: Medicaid Other | Attending: Emergency Medicine | Admitting: Emergency Medicine

## 2022-09-27 DIAGNOSIS — Z7951 Long term (current) use of inhaled steroids: Secondary | ICD-10-CM | POA: Insufficient documentation

## 2022-09-27 DIAGNOSIS — Z20822 Contact with and (suspected) exposure to covid-19: Secondary | ICD-10-CM | POA: Diagnosis not present

## 2022-09-27 DIAGNOSIS — J45909 Unspecified asthma, uncomplicated: Secondary | ICD-10-CM | POA: Diagnosis not present

## 2022-09-27 DIAGNOSIS — R109 Unspecified abdominal pain: Secondary | ICD-10-CM | POA: Diagnosis not present

## 2022-09-27 DIAGNOSIS — J189 Pneumonia, unspecified organism: Secondary | ICD-10-CM | POA: Diagnosis not present

## 2022-09-27 DIAGNOSIS — D72829 Elevated white blood cell count, unspecified: Secondary | ICD-10-CM | POA: Insufficient documentation

## 2022-09-27 DIAGNOSIS — R300 Dysuria: Secondary | ICD-10-CM | POA: Insufficient documentation

## 2022-09-27 DIAGNOSIS — R000 Tachycardia, unspecified: Secondary | ICD-10-CM | POA: Insufficient documentation

## 2022-09-27 DIAGNOSIS — R059 Cough, unspecified: Secondary | ICD-10-CM | POA: Diagnosis present

## 2022-09-27 LAB — COMPREHENSIVE METABOLIC PANEL
ALT: 56 U/L — ABNORMAL HIGH (ref 0–44)
AST: 35 U/L (ref 15–41)
Albumin: 4.3 g/dL (ref 3.5–5.0)
Alkaline Phosphatase: 83 U/L (ref 38–126)
Anion gap: 9 (ref 5–15)
BUN: 12 mg/dL (ref 6–20)
CO2: 22 mmol/L (ref 22–32)
Calcium: 9.2 mg/dL (ref 8.9–10.3)
Chloride: 102 mmol/L (ref 98–111)
Creatinine, Ser: 0.69 mg/dL (ref 0.44–1.00)
GFR, Estimated: >60 mL/min (ref >60)
Glucose, Bld: 122 mg/dL — ABNORMAL HIGH (ref 70–99)
Potassium: 3.7 mmol/L (ref 3.5–5.1)
Sodium: 133 mmol/L — ABNORMAL LOW (ref 135–145)
Total Bilirubin: 1 mg/dL (ref 0.3–1.2)
Total Protein: 7 g/dL (ref 6.5–8.1)

## 2022-09-27 LAB — CBC WITH DIFFERENTIAL/PLATELET
Abs Immature Granulocytes: 0.21 10*3/uL — ABNORMAL HIGH (ref 0.00–0.07)
Basophils Absolute: 0.1 10*3/uL (ref 0.0–0.1)
Basophils Relative: 0 %
Eosinophils Absolute: 0 10*3/uL (ref 0.0–0.5)
Eosinophils Relative: 0 %
HCT: 41.6 % (ref 36.0–46.0)
Hemoglobin: 14.3 g/dL (ref 12.0–15.0)
Immature Granulocytes: 1 %
Lymphocytes Relative: 4 %
Lymphs Abs: 1 10*3/uL (ref 0.7–4.0)
MCH: 29.2 pg (ref 26.0–34.0)
MCHC: 34.4 g/dL (ref 30.0–36.0)
MCV: 85.1 fL (ref 80.0–100.0)
Monocytes Absolute: 1.8 10*3/uL — ABNORMAL HIGH (ref 0.1–1.0)
Monocytes Relative: 6 %
Neutro Abs: 24.9 10*3/uL — ABNORMAL HIGH (ref 1.7–7.7)
Neutrophils Relative %: 89 %
Platelets: 234 10*3/uL (ref 150–400)
RBC: 4.89 MIL/uL (ref 3.87–5.11)
RDW: 13.7 % (ref 11.5–15.5)
WBC: 27.9 10*3/uL — ABNORMAL HIGH (ref 4.0–10.5)
nRBC: 0 % (ref 0.0–0.2)

## 2022-09-27 LAB — RESP PANEL BY RT-PCR (RSV, FLU A&B, COVID)  RVPGX2
Influenza A by PCR: NEGATIVE
Influenza B by PCR: NEGATIVE
Resp Syncytial Virus by PCR: NEGATIVE
SARS Coronavirus 2 by RT PCR: NEGATIVE

## 2022-09-27 LAB — PREGNANCY, URINE: Preg Test, Ur: NEGATIVE

## 2022-09-27 LAB — URINALYSIS, ROUTINE W REFLEX MICROSCOPIC
Bilirubin Urine: NEGATIVE
Glucose, UA: NEGATIVE mg/dL
Hgb urine dipstick: NEGATIVE
Ketones, ur: NEGATIVE mg/dL
Leukocytes,Ua: NEGATIVE
Nitrite: NEGATIVE
Specific Gravity, Urine: 1.022 (ref 1.005–1.030)
pH: 6 (ref 5.0–8.0)

## 2022-09-27 MED ORDER — ACETAMINOPHEN 325 MG PO TABS
650.0000 mg | ORAL_TABLET | Freq: Once | ORAL | Status: AC
  Administered 2022-09-27: 650 mg via ORAL
  Filled 2022-09-27: qty 2

## 2022-09-27 MED ORDER — LACTATED RINGERS IV BOLUS
1000.0000 mL | Freq: Once | INTRAVENOUS | Status: AC
  Administered 2022-09-27: 1000 mL via INTRAVENOUS

## 2022-09-27 MED ORDER — IBUPROFEN 400 MG PO TABS
600.0000 mg | ORAL_TABLET | Freq: Once | ORAL | Status: AC
  Administered 2022-09-27: 600 mg via ORAL
  Filled 2022-09-27: qty 1

## 2022-09-27 MED ORDER — LEVOFLOXACIN 750 MG PO TABS: 750.0000 mg | ORAL_TABLET | Freq: Every day | ORAL | 0 refills | Status: AC

## 2022-09-27 MED ORDER — GUAIFENESIN 100 MG/5ML PO LIQD: 5.0000 mL | ORAL | 0 refills | Status: DC | PRN

## 2022-09-27 MED ORDER — LEVOFLOXACIN IN D5W 750 MG/150ML IV SOLN
750.0000 mg | Freq: Once | INTRAVENOUS | Status: AC
  Administered 2022-09-27: 750 mg via INTRAVENOUS
  Filled 2022-09-27: qty 150

## 2022-09-27 NOTE — Discharge Instructions (Addendum)
You were seen in the emergency department today for back pain. You have pneumonia in your right lower lung. This is the source of your fever and back pain. You will need antibiotics for this and I have prescribed you Levaquin for the next week. Please return to the emergency department if you have worsening shortness of breath or difficulty breathing and ongoing fever. I would also like you to follow-up in the next 72 hours with you primary care provider.

## 2022-09-27 NOTE — ED Notes (Signed)
Patient given discharge instructions. Questions were answered. Patient verbalized understanding of discharge instructions and care at home. ? ?Discharged with family ?

## 2022-09-27 NOTE — ED Provider Notes (Signed)
[facility] Provider Note   CSN: [id number] Arrival date & time: 09/27/22  [id number]     History  Chief Complaint  Patient presents with   Flank Pain    Angel [name] is a 32 y.o. female.  With past medical history of asthma, anxiety who presents to the emergency department with flank pain.  Patient states symptoms began yesterday morning.  She states that she woke up and felt like she had a backache.  She states that she took a hot shower to try and improve her symptoms and afterwards started having chills.  She states that she continued to feel poorly so she took a nap and when she woke up she was sweating profusely.  States that she had taken Tylenol and Excedrin and continue to have sweating so she took her temperature and it was 103.  She states that she went to urgent care and they tested her for COVID and flu which was negative.  They gave her a breathing treatment and performed a UA.  She was noted to have > 300 proteinuria without obvious UTI. They instructed her she could go to ER or watchful waiting at home. She states overnight she continues to have chills and diaphoresis and worsening right sided back pain. She endorses mild cough but states this is not bothering her and has severe persistent asthma. She states maybe 2 days ago she had mild dysuria but symptoms did not persist. She had one episode of vomiting yesterday. She denies abdominal pain, diarrhea, hematuria, urgency, frequency. Denies history of back surgery. Denies IVDU.   HPI     Home Medications Prior to Admission medications   Medication Sig Start Date End Date Taking? Authorizing Provider  guaiFENesin (ROBITUSSIN) 100 MG/5ML liquid Take 5 mLs by mouth every 4 (four) hours as needed for cough or to loosen phlegm. 09/27/22  Yes Mickie Hillier, PA-C  levofloxacin (LEVAQUIN) 750 MG tablet Take 1 tablet (750 mg total) by mouth daily for 5 days. 09/27/22 10/02/22 Yes Mickie Hillier, PA-C  albuterol (PROVENTIL HFA;VENTOLIN HFA) 108 (90 Base) MCG/ACT inhaler Inhale 2 puffs into the lungs every 6 (six) hours as needed for wheezing or shortness of breath.    [provider]  buPROPion (WELLBUTRIN SR) 150 MG 12 hr tablet Take 150 mg by mouth daily. 06/02/21   [provider]  citalopram (CELEXA) 20 MG tablet Take 20 mg by mouth daily. 03/05/22 03/05/23  [provider]  escitalopram (LEXAPRO) 20 MG tablet Take 20 mg by mouth daily. Patient not taking: Reported on 09/14/2022    [provider]  fluticasone-salmeterol (ADVAIR HFA) 230-21 MCG/ACT inhaler Inhale 2 puffs into the lungs 2 (two) times daily. 08/03/22   Icard, Octavio Graves, DO  LORazepam (ATIVAN) 0.5 MG tablet Take 0.5 mg by mouth 2 (two) times daily as needed for anxiety.    [provider]  montelukast (SINGULAIR) 10 MG tablet Take 1 tablet (10 mg total) by mouth at bedtime. 09/14/22   Icard, Octavio Graves, DO  naltrexone (DEPADE) 50 MG tablet Take half tablet (25 mg) every morning for 1 week, then half tablet (25mg ) morning and night for next week, then full tablet (50 mg) every morning and half tablet (25mg ) every night for 1 week, then a full tablet (50mg ) every morning and night thereafter    [provider]  polyethylene glycol (MIRALAX / GLYCOLAX) packet Take 17 g by mouth daily as needed for mild  constipation.     [provider]  Tezepelumab-ekko (TEZSPIRE) 210 MG/1.91ML SOAJ Inject 210 mg into the skin every 28 (twenty-eight) days. 09/22/22   Icard, Octavio Graves, DO  umeclidinium bromide (INCRUSE ELLIPTA) 62.5 MCG/ACT AEPB Inhale 1 puff into the lungs daily. 08/03/22 11/01/22  Garner Nash, DO      Allergies    Cefzil [cefprozil], Cefprozil, Augmentin [amoxicillin-pot clavulanate], Vancomycin, and Vancomycin    Review of Systems   Review of Systems  Constitutional:  Positive for chills, fatigue and fever.  Genitourinary:  Positive for flank pain.   All other systems reviewed and are negative.   Physical Exam Updated Vital Signs BP 110/75   Pulse (!) 106   Temp 98.2 F (36.8 C) (Oral)   Resp 20   SpO2 99%  Physical Exam Vitals and nursing note reviewed.  Constitutional:      General: She is not in acute distress.    Appearance: Normal appearance. She is obese. She is ill-appearing.     Comments: Tired appearing, skin feels hot but afebrile   HENT:     Head: Normocephalic.     Mouth/Throat:     Mouth: Mucous membranes are moist.     Pharynx: Oropharynx is clear.  Eyes:     General: No scleral icterus.    Extraocular Movements: Extraocular movements intact.  Cardiovascular:     Rate and Rhythm: Regular rhythm. Tachycardia present.     Pulses: Normal pulses.     Heart sounds: No murmur heard. Pulmonary:     Effort: Pulmonary effort is normal. No respiratory distress.     Breath sounds: Wheezing present.  Abdominal:     General: Bowel sounds are normal. There is no distension.     Palpations: Abdomen is soft.     Tenderness: There is no abdominal tenderness. There is right CVA tenderness. There is no left CVA tenderness.  Skin:    General: Skin is warm and dry.     Capillary Refill: Capillary refill takes less than 2 seconds.  Neurological:     General: No focal deficit present.     Mental Status: She is alert and oriented to person, place, and time.  Psychiatric:        Mood and Affect: Mood normal.        Behavior: Behavior normal.        Thought Content: Thought content normal.        Judgment: Judgment normal.     ED Results / Procedures / Treatments   Labs (all labs ordered are listed, but only abnormal results are displayed) Labs Reviewed  URINALYSIS, ROUTINE W REFLEX MICROSCOPIC - Abnormal; Notable for the following components:      Result Value   Protein, ur TRACE (*)    All other components within normal limits  COMPREHENSIVE METABOLIC PANEL - Abnormal; Notable for the following components:    Sodium 133 (*)    Glucose, Bld 122 (*)    ALT 56 (*)    All other components within normal limits  CBC WITH DIFFERENTIAL/PLATELET - Abnormal; Notable for the following components:   WBC 27.9 (*)    Neutro Abs 24.9 (*)    Monocytes Absolute 1.8 (*)    Abs Immature Granulocytes 0.21 (*)    All other components within normal limits  RESP PANEL BY RT-PCR (RSV, FLU A&B, COVID)  [id number]  URINE CULTURE  PREGNANCY, URINE    EKG None  Radiology CT Renal Stone Study  Result  Date: 09/27/2022 CLINICAL DATA:  Flank pain, fever EXAM: CT ABDOMEN AND PELVIS WITHOUT CONTRAST TECHNIQUE: Multidetector CT imaging of the abdomen and pelvis was performed following the standard protocol without IV contrast. RADIATION DOSE REDUCTION: This exam was performed according to the departmental dose-optimization program which includes automated exposure control, adjustment of the mA and/or kV according to patient size and/or use of iterative reconstruction technique. COMPARISON:  05/05/2017 FINDINGS: Lower chest: Dense airspace consolidation within the posteromedial segment of the right lower lobe. Left lung bases clear. Heart size is normal. Hepatobiliary: No focal liver abnormality is seen. Status post cholecystectomy. No biliary dilatation. Pancreas: Unremarkable. No pancreatic ductal dilatation or surrounding inflammatory changes. Spleen: Normal in size without focal abnormality. Adrenals/Urinary Tract: Adrenal glands are unremarkable. Kidneys are normal, without renal calculi, focal lesion, or hydronephrosis. No ureteral calculi. Tiny bubble of air within the urinary bladder. Bladder appears otherwise unremarkable without wall thickening or adjacent fat stranding. Stomach/Bowel: Stomach is within normal limits. No evidence of appendicitis. No evidence of bowel wall thickening, distention, or inflammatory changes. Vascular/Lymphatic: No significant vascular findings are present. No enlarged abdominal or pelvic lymph nodes.  Reproductive: Uterus and bilateral adnexa are unremarkable. Other: No free-fluid. No free air. Rectus diastasis. Prior umbilical hernia repair. Musculoskeletal: No acute or significant osseous findings. IMPRESSION: 1. Dense airspace consolidation within the right lower lobe, compatible with pneumonia. 2. No nephrolithiasis or hydronephrosis. 3. Tiny bubble of air within the urinary bladder, which may be secondary to recent instrumentation or infection. Correlate with urinalysis. Electronically Signed   By: Davina Poke D.O.   On: 09/27/2022 10:22    Procedures Procedures   Medications Ordered in ED Medications  lactated ringers bolus 1,000 mL (0 mLs Intravenous Stopped 09/27/22 1029)  lactated ringers bolus 1,000 mL (0 mLs Intravenous Stopped 09/27/22 1247)  levofloxacin (LEVAQUIN) IVPB 750 mg (750 mg Intravenous New Bag/Given 09/27/22 1124)  ibuprofen (ADVIL) tablet 600 mg (600 mg Oral Given 09/27/22 1124)  acetaminophen (TYLENOL) tablet 650 mg (650 mg Oral Given 09/27/22 1124)    ED Course/ Medical Decision Making/ A&P    Medical Decision Making Amount and/or Complexity of Data Reviewed Labs: ordered. Radiology: ordered.  Risk OTC drugs. Prescription drug management.  Initial Impression and Ddx 32 year old female who presents to the emergency department with right-sided flank pain, fever Patient PMH that increases complexity of ED encounter: Asthma Differential: pyelonephritis, stone, obstructed stone, infected stone, myositis, etc.   Interpretation of Diagnostics I independent reviewed and interpreted the labs as followed: wbc 27.9, cmp without elyte derangement, AKI, transaminitis. UA negative. Resp panel negative  - I independently visualized the following imaging with scope of interpretation limited to determining acute life threatening conditions related to emergency care: CT renal stone study, which revealed RLL pneumonia  Patient Reassessment and Ultimate  Disposition/Management Initial assessment ill-appearing 32 year old female.  She does have right-sided CVA tenderness on my exam.  No abdominal tenderness.  Her skin feels hot.  She has mild wheezing but no tachypnea or respiratory distress.  Will obtain labs, IV fluids, anticipate CT scan of the abdomen.  Labs with significant leukocytosis to 27. Currently afebrile. Going for renal stone study.  - UA without UTI, or proteinuria. Not pregnant.  - CMP negative.  - CT renal stone study actually reveals RLL pneumonia. Likely the source of her right back pain. As noted in her urgent care note and on my assessment she has had cough and congestion. She does not stress this as her main complaint, only  the back pain. She has received IVF here. She is overall ill appearing but non toxic appearing. She is oxygenating 100% on room air. I have considered and doubt PE. I do not think this is a pulmonary infarct. Do not feel she needs dedicated CT chest at this time.  Dr. Wyvonnia Dusky, ED attending also went and evaluated the patient given her leukocytosis and agrees with the above.   1110: Ambulatory O2 was 99%. Mild tachycardia. She felt mildly short of breath without respiratory distress.   1115: Will give her a 2nd liter of IVF with first dose IV levaquin to optimize fluids status. Has PCN and cephalosporin allergy. Will reassess but anticipate discharge after these interventions.   1255: 2nd liter and Levaquin finished. She is feeling improved after 2nd liter. Will discharge with strict return precautions for worsening symptoms. Also instructed her to f/u with PCP in 72 hours for recheck of symptoms. She verbalized understanding.  The patient has been appropriately medically screened and/or stabilized in the ED. I have low suspicion for any other emergent medical condition which would require further screening, evaluation or treatment in the ED or require inpatient management. At time of discharge the patient is  hemodynamically stable and in no acute distress. I have discussed work-up results and diagnosis with patient and answered all questions. Patient is agreeable with discharge plan. We discussed strict return precautions for returning to the emergency department and they verbalized understanding.     Patient management required discussion with the following services or consulting groups:  None  Complexity of Problems Addressed Acute complicated illness or Injury  Additional Data Reviewed and Analyzed Further history obtained from: Further history from spouse/family member, Past medical history and medications listed in the EMR, Prior ED visit notes, Care Everywhere, and Prior labs/imaging results  Patient Encounter Risk Assessment Prescriptions and Consideration of hospitalization  Final Clinical Impression(s) / ED Diagnoses Final diagnoses:  Community acquired pneumonia of right lower lobe of lung    Rx / DC Orders ED Discharge Orders          Ordered    levofloxacin (LEVAQUIN) 750 MG tablet  Daily        09/27/22 1257    guaiFENesin (ROBITUSSIN) 100 MG/5ML liquid  Every 4 hours PRN        09/27/22 1258              Mickie Hillier, PA-C 09/27/22 1259    Ezequiel Essex, MD 09/28/22 [phone]

## 2022-09-27 NOTE — ED Notes (Signed)
UA

## 2022-09-27 NOTE — ED Triage Notes (Signed)
Woke up yesterday with back pain ,not feeling well, vomiting, chills, fatigued, diaphoretic. Fever 103 yesterday after tylenol. Urgent care yesterday , test negative for flu and covid. Sent urine sample, saw protein in it. Told to come to ED if not better today, also an asthmatic , had breathing treatment yesterday at [facility].

## 2022-09-28 LAB — URINE CULTURE: Culture: NO GROWTH

## 2022-10-01 ENCOUNTER — Other Ambulatory Visit (HOSPITAL_COMMUNITY): Payer: Self-pay

## 2022-10-02 ENCOUNTER — Other Ambulatory Visit (HOSPITAL_COMMUNITY): Payer: Self-pay

## 2022-10-07 ENCOUNTER — Other Ambulatory Visit: Payer: Self-pay

## 2022-10-07 ENCOUNTER — Other Ambulatory Visit (HOSPITAL_COMMUNITY): Payer: Self-pay

## 2022-10-08 ENCOUNTER — Other Ambulatory Visit: Payer: Self-pay

## 2022-10-26 ENCOUNTER — Other Ambulatory Visit: Payer: Self-pay | Admitting: Pulmonary Disease

## 2022-11-23 ENCOUNTER — Telehealth: Payer: Self-pay

## 2022-11-23 ENCOUNTER — Other Ambulatory Visit (HOSPITAL_COMMUNITY): Payer: Self-pay

## 2022-11-23 NOTE — Telephone Encounter (Signed)
PA request received via CMM for Fluticasone-Salmeterol 230-21MCG/ACT aerosol  PA not submitted due to test claim showing Brand Advair HFA is preferred and covered.  Key: [id number]

## 2022-12-31 ENCOUNTER — Other Ambulatory Visit (HOSPITAL_COMMUNITY): Payer: Self-pay

## 2022-12-31 ENCOUNTER — Other Ambulatory Visit: Payer: Self-pay

## 2023-01-04 ENCOUNTER — Other Ambulatory Visit (HOSPITAL_COMMUNITY): Payer: Self-pay

## 2023-01-13 ENCOUNTER — Telehealth: Payer: Self-pay

## 2023-01-13 ENCOUNTER — Other Ambulatory Visit (HOSPITAL_COMMUNITY): Payer: Self-pay

## 2023-01-13 NOTE — Telephone Encounter (Signed)
Patient Advocate Encounter   Received notification from [facility] [place] that prior authorization is required for Fluticasone-Salmeterol 230-21MCG/ACT aerosol   Submitted: n/a Key n/a  PA not submitted at this time as it is covered if processed as BRAND Advair HFA

## 2023-01-21 ENCOUNTER — Encounter: Payer: Self-pay | Admitting: Pulmonary Disease

## 2023-01-21 ENCOUNTER — Other Ambulatory Visit (HOSPITAL_COMMUNITY): Payer: Self-pay

## 2023-01-21 ENCOUNTER — Ambulatory Visit: Payer: Medicaid Other | Admitting: Pulmonary Disease

## 2023-01-21 ENCOUNTER — Telehealth: Payer: Self-pay | Admitting: Pulmonary Disease

## 2023-01-21 VITALS — BP 120/70 | HR 70 | Ht 65.0 in | Wt 199.4 lb

## 2023-01-21 DIAGNOSIS — D721 Eosinophilia, unspecified: Secondary | ICD-10-CM

## 2023-01-21 DIAGNOSIS — J4550 Severe persistent asthma, uncomplicated: Secondary | ICD-10-CM | POA: Diagnosis not present

## 2023-01-21 DIAGNOSIS — Z7189 Other specified counseling: Secondary | ICD-10-CM

## 2023-01-21 MED ORDER — ADVAIR HFA 230-21 MCG/ACT IN AERO: 2.0000 | INHALATION_SPRAY | Freq: Two times a day (BID) | RESPIRATORY_TRACT | 11 refills | Status: DC

## 2023-01-21 MED ORDER — FLUTICASONE-SALMETEROL 230-21 MCG/ACT IN AERO: 2.0000 | INHALATION_SPRAY | Freq: Two times a day (BID) | RESPIRATORY_TRACT | 12 refills | Status: DC

## 2023-01-21 MED ORDER — INCRUSE ELLIPTA 62.5 MCG/ACT IN AEPB: 1.0000 | INHALATION_SPRAY | Freq: Every day | RESPIRATORY_TRACT | 11 refills | Status: DC

## 2023-01-21 NOTE — Patient Instructions (Addendum)
Thank you for visiting Dr. Tonia Brooms at [facility]. Today we recommend the following:  Meds ordered this encounter  Medications   DISCONTD: fluticasone-salmeterol (ADVAIR HFA) 230-21 MCG/ACT inhaler    Sig: Inhale 2 puffs into the lungs 2 (two) times daily.    Dispense:  8 g    Refill:  12   fluticasone-salmeterol (ADVAIR HFA) 230-21 MCG/ACT inhaler    Sig: Inhale 2 puffs into the lungs 2 (two) times daily.    Dispense:  36 g    Refill:  11   umeclidinium bromide (INCRUSE ELLIPTA) 62.5 MCG/ACT AEPB    Sig: Inhale 1 puff into the lungs daily.    Dispense:  30 each    Refill:  11   Return in about 1 year (around 01/21/2024), or if symptoms worsen or fail to improve.    Please do your part to reduce the spread of COVID-19.

## 2023-01-21 NOTE — Telephone Encounter (Signed)
Pt states that her insurance needs approval from Dr.

## 2023-01-21 NOTE — Progress Notes (Signed)
Synopsis: Referred in January 2024 for self-referral, asthma by No ref. provider found  Subjective:   PATIENT ID: Angel [name] GENDER: female DOB: 1991/03/22, MRN: [id number]  Chief Complaint  Patient presents with   Follow-up    F/up on Tezspire.    This is a 32 year old female, past medical history of anxiety, asthma.  Prior surgical history, C-section with tubal ligation.She presents for evaluation of ongoing issues related to her asthma.  She was diagnosed with influenza on 07/08/2022. Currently on albuterol and advair 45 HFA.  She has had multiple exacerbations this year.  She been put on prednisone several times.  I also care for her mother who has atopic asthma on multiple medications and biologic injections.  Her husband still smokes in the home.  She used to smoke several years ago and quit.  She stopped when she was pregnant with her 2 twins.  OV 09/14/2022: Here today for follow-up after blood work.  She was also started on triple therapy inhaler.  She does feel better but is still having some breakthrough symptoms.  Still using her albuterol throughout the week.  Blood work shows relatively negative RAST panel, IgE of 51 and absolute eosinophil count of 1400.  OV 01/21/2023: Patient here today for follow-up.  She is doing really well after starting of her injection therapy.  No exacerbations no antibiotics no steroids.  She is breathing better.  She does need refills on her albuterol, Advair HFA and Incruse.    Past Medical History:  Diagnosis Date   Anxiety    Asthma    Constipation      Family History  Problem Relation Age of Onset   Asthma Mother    Arthritis Mother    Diabetes Father    Hypertension Father    Cancer Maternal Grandmother    Heart attack Maternal Grandfather    Cancer Maternal Grandfather    Hypertension Paternal Grandfather      Past Surgical History:  Procedure Laterality Date   ADENOIDECTOMY     arm surgery     right   CESAREAN  SECTION N/A 11/10/2015   Procedure: CESAREAN SECTION;  Surgeon: Marcelle Overlie, MD;  Location: [facility] BIRTHING SUITES;  Service: Obstetrics;  Laterality: N/A;   CESAREAN SECTION WITH BILATERAL TUBAL LIGATION N/A 07/05/2018   Procedure: REPEAT CESAREAN SECTION WITH BILATERAL TUBAL LIGATION;  Surgeon: Ranae Pila, MD;  Location: [facility] BIRTHING SUIT[facility];  Service: Obstetrics;  Laterality: N/A;  Repeat edc 07/12/18 allergy to cefzil, vancomycin, augmentin Tracey RNFA   HERNIA REPAIR     umbilical   TUBAL LIGATION     VENTRAL HERNIA REPAIR N/A 12/22/2018   Procedure: LAPAROSCOPIC VENTRAL HERNIA REPAIR WITH MESH;  Surgeon: Darnell Level, MD;  Location: [facility] ORS;  Service: General;  Laterality: N/A;    Social History   Socioeconomic History   Marital status: Married    Spouse name: Not on file   Number of children: Not on file   Years of education: Not on file   Highest education level: Not on file  Occupational History   Not on file  Tobacco Use   Smoking status: Former    Current packs/day: 1.00    Average packs/day: 1 pack/day for 3.0 years (3.0 ttl pk-yrs)    Types: Cigarettes   Smokeless tobacco: Never   Tobacco comments:    quit 2017  Vaping Use   Vaping status: Never Used  Substance and Sexual Activity   Alcohol use: Yes  Alcohol/week: 1.0 standard drink of alcohol    Types: 1 Glasses of wine per week    Comment: Rare   Drug use: No   Sexual activity: Yes    Birth control/protection: None  Other Topics Concern   Not on file  Social History Narrative   Not on file   Social Determinants of Health   Financial Resource Strain: Low Risk  (06/20/2018)   Overall Financial Resource Strain (CARDIA)    Difficulty of Paying Living Expenses: Not hard at all  Food Insecurity: No Food Insecurity (06/20/2018)   Hunger Vital Sign    Worried About Running Out of Food in the Last Year: Never true    Ran Out of Food in the Last Year: Never true  Transportation Needs: Unknown  (06/20/2018)   PRAPARE - Administrator, Civil Service (Medical): No    Lack of Transportation (Non-Medical): Not on file  Physical Activity: Not on file  Stress: Stress Concern Present (06/20/2018)   [employer] of Occupational Health - Occupational Stress Questionnaire    Feeling of Stress : To some extent  Social Connections: Not on file  Intimate Partner Violence: Not At Risk (06/20/2018)   Humiliation, Afraid, Rape, and Kick questionnaire    Fear of Current or Ex-Partner: No    Emotionally Abused: No    Physically Abused: No    Sexually Abused: No     Allergies  Allergen Reactions   Cefzil [Cefprozil] Shortness Of Breath and Swelling   Cefprozil Other (See Comments)    turned "blue"   Augmentin [Amoxicillin-Pot Clavulanate] Rash    Did it involve swelling of the face/tongue/throat, SOB, or low BP? No Did it involve sudden or severe rash/hives, skin peeling, or any reaction on the inside of your mouth or nose? Yes Did you need to seek medical attention at a hospital or doctor's office? Yes When did it last happen? 2012       If all above answers are "NO", may proceed with cephalosporin use.   Vancomycin Itching and Rash   Vancomycin Rash     Outpatient Medications Prior to Visit  Medication Sig Dispense Refill   albuterol (PROVENTIL HFA;VENTOLIN HFA) 108 (90 Base) MCG/ACT inhaler Inhale 2 puffs into the lungs every 6 (six) hours as needed for wheezing or shortness of breath.     ALPRAZolam (XANAX) 1 MG tablet Take 1 mg by mouth 2 (two) times daily.     fluticasone-salmeterol (ADVAIR HFA) 230-21 MCG/ACT inhaler Inhale 2 puffs into the lungs 2 (two) times daily. 1 each 12   lisdexamfetamine (VYVANSE) 30 MG capsule Take 30 mg by mouth daily.     LORazepam (ATIVAN) 0.5 MG tablet Take 0.5 mg by mouth 2 (two) times daily as needed for anxiety.     montelukast (SINGULAIR) 10 MG tablet Take 1 tablet (10 mg total) by mouth at bedtime. 30 tablet 11   polyethylene  glycol (MIRALAX / GLYCOLAX) packet Take 17 g by mouth daily as needed for mild constipation.      Tezepelumab-ekko (TEZSPIRE) 210 MG/1. SOAJ Inject 210 mg into the skin every 28 (twenty-eight) days. 1.91 mL 5   INCRUSE ELLIPTA 62.5 MCG/ACT AEPB TAKE 1 PUFF BY MOUTH EVERY DAY 30 each 2   buPROPion (WELLBUTRIN SR) 150 MG 12 hr tablet Take 150 mg by mouth daily. (Patient not taking: Reported on 01/21/2023)     citalopram (CELEXA) 20 MG tablet Take 20 mg by mouth daily. (Patient not taking: Reported on  01/21/2023)     escitalopram (LEXAPRO) 20 MG tablet Take 20 mg by mouth daily. (Patient not taking: Reported on 09/14/2022)     guaiFENesin (ROBITUSSIN) 100 MG/5ML liquid Take 5 mLs by mouth every 4 (four) hours as needed for cough or to loosen phlegm. (Patient not taking: Reported on 01/21/2023) 120 mL 0   naltrexone (DEPADE) 50 MG tablet Take half tablet (25 mg) every morning for 1 week, then half tablet (25mg ) morning and night for next week, then full tablet (50 mg) every morning and half tablet (25mg ) every night for 1 week, then a full tablet (50mg ) every morning and night thereafter (Patient not taking: Reported on 01/21/2023)     No facility-administered medications prior to visit.    Review of Systems  Constitutional:  Negative for chills, fever, malaise/fatigue and weight loss.  HENT:  Negative for hearing loss, sore throat and tinnitus.   Eyes:  Negative for blurred vision and double vision.  Respiratory:  Positive for shortness of breath. Negative for cough, hemoptysis, sputum production, wheezing and stridor.   Cardiovascular:  Negative for chest pain, palpitations, orthopnea, leg swelling and PND.  Gastrointestinal:  Negative for abdominal pain, constipation, diarrhea, heartburn, nausea and vomiting.  Genitourinary:  Negative for dysuria, hematuria and urgency.  Musculoskeletal:  Negative for joint pain and myalgias.  Skin:  Negative for itching and rash.  Neurological:  Negative for  dizziness, tingling, weakness and headaches.  Endo/Heme/Allergies:  Negative for environmental allergies. Does not bruise/bleed easily.  Psychiatric/Behavioral:  Negative for depression. The patient is not nervous/anxious and does not have insomnia.   All other systems reviewed and are negative.    Objective:  Physical Exam Vitals reviewed.  Constitutional:      General: She is not in acute distress.    Appearance: She is well-developed. She is obese.  HENT:     Head: Normocephalic and atraumatic.  Eyes:     General: No scleral icterus.    Conjunctiva/sclera: Conjunctivae normal.     Pupils: Pupils are equal, round, and reactive to light.  Neck:     Vascular: No JVD.     Trachea: No tracheal deviation.  Cardiovascular:     Rate and Rhythm: Normal rate and regular rhythm.     Heart sounds: Normal heart sounds. No murmur heard. Pulmonary:     Effort: Pulmonary effort is normal. No tachypnea, accessory muscle usage or respiratory distress.     Breath sounds: Normal breath sounds. No stridor. No wheezing, rhonchi or rales.  Abdominal:     General: Bowel sounds are normal. There is no distension.     Palpations: Abdomen is soft.     Tenderness: There is no abdominal tenderness.  Musculoskeletal:        General: No tenderness.     Cervical back: Neck supple.  Lymphadenopathy:     Cervical: No cervical adenopathy.  Skin:    General: Skin is warm and dry.     Capillary Refill: Capillary refill takes less than 2 seconds.     Findings: No rash.  Neurological:     Mental Status: She is alert and oriented to person, place, and time.  Psychiatric:        Behavior: Behavior normal.      Vitals:   01/21/23 0857  BP: 120/70  Pulse: 70  SpO2: 98%  Weight: 199 lb 6.4 oz (90.4 kg)  Height: 5\' 5"  (1.651 m)    98% on RA BMI Readings from Last 3 Encounters:  01/21/23 33.18 kg/m  09/14/22 34.81 kg/m  08/03/22 35.55 kg/m   Wt Readings from Last 3 Encounters:  01/21/23 199  lb 6.4 oz (90.4 kg)  09/14/22 209 lb 3.2 oz (94.9 kg)  08/03/22 213 lb 10.1 oz (96.9 kg)     CBC    Component Value Date/Time   WBC 27.9 (H) 09/27/2022 0921   RBC 4.89 09/27/2022 0921   HGB 14.3 09/27/2022 0921   HCT 41.6 09/27/2022 0921   PLT 234 09/27/2022 0921   MCV 85.1 09/27/2022 0921   MCH 29.2 09/27/2022 0921   MCHC 34.4 09/27/2022 0921   RDW 13.7 09/27/2022 0921   LYMPHSABS 1.0 09/27/2022 0921   MONOABS 1.8 (H) 09/27/2022 0921   EOSABS 0.0 09/27/2022 0921   BASOSABS 0.1 09/27/2022 0921     Chest Imaging: No recent chest imaging  Pulmonary Functions Testing Results:     No data to display          FeNO:   Pathology:   Echocardiogram:   Heart Catheterization:     Assessment & Plan:     ICD-10-CM   1. Severe persistent asthma without complication  J45.50     2. Injection education, encounter for  Z71.89     3. Elevated Serum Eosinophils  D72.10        Discussion:  This is a 32 year old female, severe persistent asthma, BMI 33.  Had symptoms despite triple therapy inhaler regimen.  Had frequent exacerbations.  Now started on biologic therapy with tezepelumab.  Patient has done well with this.  Plan: Will have her follow-up with [place] in 1 year Refills today of Advair HFA plus Incruse Continue on her triple therapy inhaler regimen plus Singulair. As needed albuterol. She is going to let us know if she has any exacerbations throughout the year but otherwise I suspect she will continue to do fine. We appreciate the pharmacy team with help set up the process for biologic injections at home.    Current Outpatient Medications:    albuterol (PROVENTIL HFA;VENTOLIN HFA) 108 (90 Base) MCG/ACT inhaler, Inhale 2 puffs into the lungs every 6 (six) hours as needed for wheezing or shortness of breath., Disp: , Rfl:    ALPRAZolam (XANAX) 1 MG tablet, Take 1 mg by mouth 2 (two) times daily., Disp: , Rfl:    fluticasone-salmeterol (ADVAIR HFA) 230-21 MCG/ACT  inhaler, Inhale 2 puffs into the lungs 2 (two) times daily., Disp: 1 each, Rfl: 12   fluticasone-salmeterol (ADVAIR HFA) 230-21 MCG/ACT inhaler, Inhale 2 puffs into the lungs 2 (two) times daily., Disp: 36 g, Rfl: 11   lisdexamfetamine (VYVANSE) 30 MG capsule, Take 30 mg by mouth daily., Disp: , Rfl:    LORazepam (ATIVAN) 0.5 MG tablet, Take 0.5 mg by mouth 2 (two) times daily as needed for anxiety., Disp: , Rfl:    montelukast (SINGULAIR) 10 MG tablet, Take 1 tablet (10 mg total) by mouth at bedtime., Disp: 30 tablet, Rfl: 11   polyethylene glycol (MIRALAX / GLYCOLAX) packet, Take 17 g by mouth daily as needed for mild constipation. , Disp: , Rfl:    Tezepelumab-ekko (TEZSPIRE) 210 MG/1. SOAJ, Inject 210 mg into the skin every 28 (twenty-eight) days., Disp: 1.91 mL, Rfl: 5   buPROPion (WELLBUTRIN SR) 150 MG 12 hr tablet, Take 150 mg by mouth daily. (Patient not taking: Reported on 01/21/2023), Disp: , Rfl:    citalopram (CELEXA) 20 MG tablet, Take 20 mg by mouth daily. (Patient not taking: Reported on 01/21/2023), Disp: ,  Rfl:    escitalopram (LEXAPRO) 20 MG tablet, Take 20 mg by mouth daily. (Patient not taking: Reported on 09/14/2022), Disp: , Rfl:    guaiFENesin (ROBITUSSIN) 100 MG/5ML liquid, Take 5 mLs by mouth every 4 (four) hours as needed for cough or to loosen phlegm. (Patient not taking: Reported on 01/21/2023), Disp: 120 mL, Rfl: 0   naltrexone (DEPADE) 50 MG tablet, Take half tablet (25 mg) every morning for 1 week, then half tablet (25mg ) morning and night for next week, then full tablet (50 mg) every morning and half tablet (25mg ) every night for 1 week, then a full tablet (50mg ) every morning and night thereafter (Patient not taking: Reported on 01/21/2023), Disp: , Rfl:    umeclidinium bromide (INCRUSE ELLIPTA) 62.5 MCG/ACT AEPB, Inhale 1 puff into the lungs daily., Disp: 30 each, Rfl: 11   Josephine Igo, DO [facility] 01/21/2023 9:10 AM

## 2023-01-22 ENCOUNTER — Other Ambulatory Visit (HOSPITAL_COMMUNITY): Payer: Self-pay

## 2023-01-22 MED ORDER — VYVANSE 30 MG PO CAPS
30.0000 mg | ORAL_CAPSULE | Freq: Every morning | ORAL | 0 refills | Status: DC
  Filled 2023-01-22: qty 30, 30d supply, fill #0

## 2023-01-28 DIAGNOSIS — R7989 Other specified abnormal findings of blood chemistry: Secondary | ICD-10-CM | POA: Insufficient documentation

## 2023-02-02 NOTE — Telephone Encounter (Signed)
Spoke with patient.  She has gotten what she needed and will contact us if anything changes.

## 2023-02-15 ENCOUNTER — Other Ambulatory Visit (HOSPITAL_COMMUNITY): Payer: Self-pay

## 2023-02-17 ENCOUNTER — Other Ambulatory Visit (HOSPITAL_COMMUNITY): Payer: Self-pay

## 2023-02-17 MED ORDER — VYVANSE 30 MG PO CAPS
30.0000 mg | ORAL_CAPSULE | Freq: Every morning | ORAL | 0 refills | Status: DC
  Filled 2023-02-22: qty 30, 30d supply, fill #0

## 2023-02-19 ENCOUNTER — Other Ambulatory Visit (HOSPITAL_COMMUNITY): Payer: Self-pay

## 2023-02-22 ENCOUNTER — Other Ambulatory Visit (HOSPITAL_COMMUNITY): Payer: Self-pay

## 2023-02-22 ENCOUNTER — Other Ambulatory Visit: Payer: Self-pay

## 2023-02-26 ENCOUNTER — Telehealth: Payer: Self-pay | Admitting: Pharmacist

## 2023-02-26 NOTE — Telephone Encounter (Signed)
Submitted a Prior Authorization renewal request to  [employer]  for TEZSPIRE via CoverMyMeds. Will update once we receive a response.  Key: [id number]  Chesley Mires, PharmD, MPH, BCPS, CPP Clinical Pharmacist (Rheumatology and Pulmonology)

## 2023-02-26 NOTE — Telephone Encounter (Signed)
Received notification from [facility] regarding a prior authorization for TEZSPIRE. Authorization has been APPROVED from 02/26/23 to 08/25/23. Approval letter sent to scan center.  Patient can continue to fill through [facility]: [phone]   Authorization # [id number]  [employer] updated  Chesley Mires, PharmD, MPH, BCPS, CPP Clinical Pharmacist (Rheumatology and Pulmonology)

## 2023-03-23 ENCOUNTER — Other Ambulatory Visit: Payer: Self-pay | Admitting: Pulmonary Disease

## 2023-03-23 ENCOUNTER — Other Ambulatory Visit (HOSPITAL_COMMUNITY): Payer: Self-pay

## 2023-03-23 DIAGNOSIS — J4550 Severe persistent asthma, uncomplicated: Secondary | ICD-10-CM

## 2023-03-23 MED ORDER — TEZSPIRE 210 MG/1.91ML ~~LOC~~ SOAJ
210.0000 mg | SUBCUTANEOUS | 5 refills | Status: DC
  Filled 2023-03-23 (×2): qty 1.91, 28d supply, fill #0
  Filled 2023-04-22: qty 1.91, 28d supply, fill #1
  Filled 2023-05-24: qty 1.91, 28d supply, fill #2
  Filled 2023-06-22: qty 1.91, 28d supply, fill #3
  Filled 2023-07-20: qty 1.91, 28d supply, fill #4
  Filled 2023-08-17: qty 1.91, 28d supply, fill #5

## 2023-03-31 ENCOUNTER — Other Ambulatory Visit: Payer: Self-pay

## 2023-04-04 ENCOUNTER — Emergency Department (HOSPITAL_BASED_OUTPATIENT_CLINIC_OR_DEPARTMENT_OTHER): Payer: Medicaid Other | Admitting: Radiology

## 2023-04-04 ENCOUNTER — Emergency Department (HOSPITAL_BASED_OUTPATIENT_CLINIC_OR_DEPARTMENT_OTHER)
Admission: EM | Admit: 2023-04-04 | Discharge: 2023-04-04 | Disposition: A | Discharge status: Home / Self Care | Payer: Medicaid Other | Attending: Emergency Medicine | Admitting: Emergency Medicine

## 2023-04-04 DIAGNOSIS — J45909 Unspecified asthma, uncomplicated: Secondary | ICD-10-CM | POA: Diagnosis not present

## 2023-04-04 DIAGNOSIS — W228XXA Striking against or struck by other objects, initial encounter: Secondary | ICD-10-CM | POA: Insufficient documentation

## 2023-04-04 DIAGNOSIS — S99921A Unspecified injury of right foot, initial encounter: Secondary | ICD-10-CM | POA: Diagnosis present

## 2023-04-04 DIAGNOSIS — S92511A Displaced fracture of proximal phalanx of right lesser toe(s), initial encounter for closed fracture: Secondary | ICD-10-CM

## 2023-04-04 DIAGNOSIS — S62646A Nondisplaced fracture of proximal phalanx of right little finger, initial encounter for closed fracture: Secondary | ICD-10-CM | POA: Insufficient documentation

## 2023-04-04 DIAGNOSIS — Z7951 Long term (current) use of inhaled steroids: Secondary | ICD-10-CM | POA: Diagnosis not present

## 2023-04-04 LAB — PREGNANCY, URINE: Preg Test, Ur: NEGATIVE

## 2023-04-04 MED ORDER — LACTATED RINGERS IV BOLUS: 1000.0000 mL | Freq: Once | INTRAVENOUS | Status: DC

## 2023-04-04 MED ORDER — DIPHENHYDRAMINE HCL 50 MG/ML IJ SOLN: 25.0000 mg | Freq: Once | INTRAMUSCULAR | Status: DC

## 2023-04-04 MED ORDER — LORAZEPAM 2 MG/ML IJ SOLN: 2.0000 mg | Freq: Once | INTRAMUSCULAR | Status: DC

## 2023-04-04 MED ORDER — IBUPROFEN 600 MG PO TABS: 600.0000 mg | ORAL_TABLET | Freq: Four times a day (QID) | ORAL | 0 refills | Status: AC | PRN

## 2023-04-04 MED ORDER — OXYCODONE HCL 5 MG PO TABS
5.0000 mg | ORAL_TABLET | ORAL | 0 refills | Status: DC | PRN
Start: 2023-04-04 — End: 2023-04-05

## 2023-04-04 NOTE — Discharge Instructions (Addendum)
You were seen in the ER today for evaluation of your right toe pain. The Xray does show a fracture. I have included more information on toe fractures and buddy tape into the discharge paperwork. Please review. Additionally, you will need to follow up with a podiatrist. The information for one is included into the discharge paperwork, please call to schedule an appointment. For pain, I recommend 1000mg  of Tylenol and 600mg  of ibuprofen every 6 hours as needed for pain. I would take this routinely at least for the next 24-48 hours to help with pain and swelling. For break through pain, you can take the narcotic pain medication prescribed to you. Please do not drive or operate heavy machinery while on this as it can make you sleepy. If you have any concerns, new or worsening symptoms, please return to your nearest ER for re-evaluation.   Contact a doctor if: Your pain medicine is not helping. You have a fever. You notice a bad smell coming from your cast. Get help right away if: You have numbness in your toe or foot, and it is getting worse. Your toe or your foot tingles. Your toe or your foot gets cold or turns blue. You have redness or swelling in your toe or foot, and it is getting worse. You have very bad pain.

## 2023-04-04 NOTE — ED Provider Notes (Addendum)
[place] EMERGENCY DEPARTMENT AT [facility] Provider Note   CSN: [id number] Arrival date & time: 04/04/23  2952     History Chief Complaint  Patient presents with   Toe Injury    Angel [name] is a 32 y.o. female with history of asthma presents emergency room today for evaluation of right fifth toe pain.  Patient reports that she excellently hit it on the side of her Ativan last night.  She denies any wound to the area.  She denies any numbness or tingling to the area.  She reports that she woke up this morning with bruising.  She denies any tobacco, EtOH illicit drug use.  HPI     Home Medications Prior to Admission medications   Medication Sig Start Date End Date Taking? Authorizing Provider  ibuprofen (ADVIL) 600 MG tablet Take 1 tablet (600 mg total) by mouth every 6 (six) hours as needed. 04/04/23  Yes Achille Rich, PA-C  oxyCODONE (ROXICODONE) 5 MG immediate release tablet Take 1 tablet (5 mg total) by mouth every 4 (four) hours as needed for severe pain. 04/04/23  Yes Achille Rich, PA-C  albuterol (PROVENTIL HFA;VENTOLIN HFA) 108 (90 Base) MCG/ACT inhaler Inhale 2 puffs into the lungs every 6 (six) hours as needed for wheezing or shortness of breath.    [provider]  ALPRAZolam Prudy Feeler) 1 MG tablet Take 1 mg by mouth 2 (two) times daily. 01/20/23 01/20/24  [provider]  buPROPion (WELLBUTRIN SR) 150 MG 12 hr tablet Take 150 mg by mouth daily. Patient not taking: Reported on 01/21/2023 06/02/21   [provider]  fluticasone-salmeterol (ADVAIR HFA) 230-21 MCG/ACT inhaler Inhale 2 puffs into the lungs 2 (two) times daily. 08/03/22   Josephine Igo, DO  fluticasone-salmeterol (ADVAIR HFA) 230-21 MCG/ACT inhaler Inhale 2 puffs into the lungs 2 (two) times daily. 01/21/23   Icard, Rachel Bo, DO  guaiFENesin (ROBITUSSIN) 100 MG/5ML liquid Take 5 mLs by mouth every 4 (four) hours as needed for cough or to loosen phlegm. Patient not  taking: Reported on 01/21/2023 09/27/22   Cristopher Peru, PA-C  lisdexamfetamine (VYVANSE) 30 MG capsule Take 30 mg by mouth daily. 01/20/23 01/20/24  [provider]  LORazepam (ATIVAN) 0.5 MG tablet Take 0.5 mg by mouth 2 (two) times daily as needed for anxiety.    [provider]  montelukast (SINGULAIR) 10 MG tablet Take 1 tablet (10 mg total) by mouth at bedtime. 09/14/22   Icard, Rachel Bo, DO  naltrexone (DEPADE) 50 MG tablet Take half tablet (25 mg) every morning for 1 week, then half tablet (25mg ) morning and night for next week, then full tablet (50 mg) every morning and half tablet (25mg ) every night for 1 week, then a full tablet (50mg ) every morning and night thereafter Patient not taking: Reported on 01/21/2023    [provider]  polyethylene glycol (MIRALAX / GLYCOLAX) packet Take 17 g by mouth daily as needed for mild constipation.     [provider]  Tezepelumab-ekko (TEZSPIRE) 210 MG/1. SOAJ Inject 210 mg into the skin every 28 (twenty-eight) days. 03/23/23   Icard, Rachel Bo, DO  umeclidinium bromide (INCRUSE ELLIPTA) 62.5 MCG/ACT AEPB Inhale 1 puff into the lungs daily. 01/21/23   Icard, Rachel Bo, DO  VYVANSE 30 MG capsule Take 1 capsule (30 mg total) by mouth in the morning for ADHD. 02/17/23         Allergies    Cefzil [cefprozil], Cefprozil, Augmentin [amoxicillin-pot clavulanate], Vancomycin,  and Vancomycin    Review of Systems   Review of Systems  Constitutional:  Negative for chills and fever.  Musculoskeletal:  Positive for arthralgias.  Skin:  Positive for color change. Negative for wound.    Physical Exam Updated Vital Signs BP 132/86 (BP Location: Right Arm)   Pulse (!) 46 Comment: resting  Temp 98 F (36.7 C) (Oral)   Resp 16   LMP 03/30/2023 (Approximate)   SpO2 100%  Physical Exam Vitals and nursing note reviewed.  Constitutional:      General: She is not in acute distress.    Appearance: She is not  toxic-appearing.  Eyes:     General: No scleral icterus. Pulmonary:     Effort: Pulmonary effort is normal. No respiratory distress.  Musculoskeletal:        General: Tenderness and signs of injury present.     Comments: Tenderness to the fifth toe.  No tenderness to the base of the fifth meta tarsal.  No swelling or tenderness into that area as well.  There is bruising however noted to the base of the fifth toe.  Cap refill still brisk.  Palpable DP and PT pulses.  Compartments are soft.  Sensation fully intact and symmetric throughout the foot and toes.  I do not appreciate any open wounds, abrasions, or lacerations to the areas.  Skin:    General: Skin is warm and dry.  Neurological:     General: No focal deficit present.     Mental Status: She is alert. Mental status is at baseline.  Psychiatric:        Mood and Affect: Mood normal.     ED Results / Procedures / Treatments   Labs (all labs ordered are listed, but only abnormal results are displayed) Labs Reviewed  PREGNANCY, URINE    EKG None  Radiology DG Foot Complete Right  Result Date: 04/04/2023 CLINICAL DATA:  Posttraumatic toe pain.  Little toe is discolored. EXAM: RIGHT FOOT COMPLETE - 3+ VIEW COMPARISON:  None Available. FINDINGS: There is an acute oblique fracture of the right 5th proximal phalanx which is nondisplaced. This demonstrates no definite proximal or distal intra-articular extension. No other evidence of acute fracture or dislocation. Minimal joint space narrowing at the 1st metatarsophalangeal joint. There are small calcaneal spurs. Mild soft tissue swelling around the 5th MTP joint. IMPRESSION: Acute nondisplaced fracture of the right 5th proximal phalanx. Electronically Signed   By: Carey Bullocks M.D.   On: 04/04/2023 09:47    Procedures Procedures   Medications Ordered in ED Medications - No data to display  ED Course/ Medical Decision Making/ A&P                               Medical Decision  Making Amount and/or Complexity of Data Reviewed Labs: ordered. Radiology: ordered.  Risk Prescription drug management.   32 y.o. female presents to the ER for evaluation of fifth toe pain on the right. Differential diagnosis includes but is not limited to sprain, strain, dislocation, fracture. Vital signs unremarkable. Physical exam as noted above.   I independently reviewed and interpreted the patient's labs.  Pregnancy is negative.  XR imaging shows Acute nondisplaced fracture of the right 5th proximal phalanx. Per radiologist's read.   The patient does not have any pain to the base of the fifth metatarsal.  X-ray does not show any signs of fracture.  Have a lower suspicion for  any Jones fracture.  She does have a fracture to the proximal phalanx of the fifth toe.  There is no open wound or laceration.  No open fracture. We did apply buddy tape and a postop shoe when she was given crutches.  We discussed pain medication control with Tylenol and ibuprofen.  Will send her in a few of narcotic pain medication for breakthrough pain.  Discussed risk and benefits of this.  Advised not to drive or operate heavy machinery while on the medication.  Podiatry follow-up was given.  She is neuro vastly intact distally is stable for discharge home.  We discussed the results of the labs/imaging. The plan is follow up with podiatry, buddy tape, supportive care. We discussed strict return precautions and red flag symptoms. The patient verbalized their understanding and agrees to the plan. The patient is stable and being discharged home in good condition.  Portions of this report may have been transcribed using voice recognition software. Every effort was made to ensure accuracy; however, inadvertent computerized transcription errors may be present.   Final Clinical Impression(s) / ED Diagnoses Final diagnoses:  Closed fracture of proximal phalanx of lesser toe of right foot, initial encounter    Rx / DC  Orders ED Discharge Orders          Ordered    ibuprofen (ADVIL) 600 MG tablet  Every 6 hours PRN        04/04/23 1056    oxyCODONE (ROXICODONE) 5 MG immediate release tablet  Every 4 hours PRN        04/04/23 1056              Achille Rich, PA-C 04/04/23 1057    Achille Rich, PA-C 04/04/23 1057    Margarita Grizzle, MD 04/04/23 [phone]

## 2023-04-04 NOTE — ED Notes (Signed)
Pt aware of the need for a urine... Pt unable to currently provide the sample.Marland KitchenMarland Kitchen

## 2023-04-04 NOTE — ED Notes (Addendum)
Discharge paperwork given and verbally understood. Provider aware of V/S before discharge and cleared.Marland KitchenMarland Kitchen

## 2023-04-04 NOTE — ED Triage Notes (Signed)
Pt caught right 5th toe on corner of ottoman in home last night.  Toe swollen, painful and with notable bruising.

## 2023-04-05 ENCOUNTER — Ambulatory Visit: Payer: Medicaid Other | Admitting: Podiatry

## 2023-04-05 ENCOUNTER — Encounter: Payer: Self-pay | Admitting: Podiatry

## 2023-04-05 DIAGNOSIS — S92501A Displaced unspecified fracture of right lesser toe(s), initial encounter for closed fracture: Secondary | ICD-10-CM | POA: Diagnosis not present

## 2023-04-05 MED ORDER — TRAMADOL HCL 50 MG PO TABS
50.0000 mg | ORAL_TABLET | Freq: Three times a day (TID) | ORAL | 0 refills | Status: AC | PRN
Start: 2023-04-05 — End: 2023-04-10

## 2023-04-05 NOTE — Progress Notes (Unsigned)
Chief Complaint  Patient presents with   Toe Injury    5th toe right - ran into ottoman on Saturday (04/03/23), bruised, swollen and painful, went to [facility] ED-xrayed, said fracture toe, give shoe and crutches, taping it to the 4th toe, throbbing today, sent here for follow up   New Patient (Initial Visit)    Est pt 2015   HPI: 32 y.o. female presents today after injuring her foot on Saturday.  States that she ran her toe into an ottoman.  Had immediate pain.  She went to the emergency department where the toe was x-rayed and a fracture was confirmed in the fifth toe on the right foot.  She was given crutches and a surgical shoe but she states that she was big and uncomfortable.  She has been taping it to the fourth toe.  States has been throbbing.  She was given prescription for a few tablets of an opioid medication.  She is requesting additional opioid analgesics for the pinky toe fracture.  States there was no laceration or opening in the skin.  Past Medical History:  Diagnosis Date   Anxiety    Asthma    Constipation     Past Surgical History:  Procedure Laterality Date   ADENOIDECTOMY     arm surgery     right   CESAREAN SECTION N/A 11/10/2015   Procedure: CESAREAN SECTION;  Surgeon: Marcelle Overlie, MD;  Location: [facility] BIRTHING SUITES;  Service: Obstetrics;  Laterality: N/A;   CESAREAN SECTION WITH BILATERAL TUBAL LIGATION N/A 07/05/2018   Procedure: REPEAT CESAREAN SECTION WITH BILATERAL TUBAL LIGATION;  Surgeon: Ranae Pila, MD;  Location: [facility] BIRTHING SUITES;  Service: Obstetrics;  Laterality: N/A;  Repeat edc 07/12/18 allergy to cefzil, vancomycin, augmentin Tracey RNFA   HERNIA REPAIR     umbilical   TUBAL LIGATION     VENTRAL HERNIA REPAIR N/A 12/22/2018   Procedure: LAPAROSCOPIC VENTRAL HERNIA REPAIR WITH MESH;  Surgeon: Darnell Level, MD;  Location: [facility] OR[facility];  Service: General;  Laterality: N/A;   Physical Exam: Dermatology: Right fifth toe is edematous and  ecchymotic.  No open lesions noted.  Nail is intact on right fifth nailbed  Vascular: Palpable pedal pulses right foot. Capillary refill within normal limits.   Neurological: Light touch sensation grossly intact   Musculoskeletal Exam: Pain on palpation right fifth toe and near the proximal phalanx and PIP joint.  Did not attempt range of motion due to already diagnosed fracture and pain to the patient.  Radiographic Exam (right foot x-rays from [facility] health emergency department at [facility] on 04/04/2023):  There is a spiral fracture noted in the fifth proximal phalanx.  This is an extra-articular fracture.  The fracture line extends from distal medial to proximal lateral.  It is in anatomic position.  No significant shortening or displacement is noted.  No erosive changes are seen  Assessment/Plan of Care: 1. Closed fracture of phalanx of right fifth toe, initial encounter     Meds ordered this encounter  Medications   traMADol (ULTRAM) 50 MG tablet    Sig: Take 1 tablet (50 mg total) by mouth every 8 (eight) hours as needed for up to 5 days.    Dispense:  15 tablet    Refill:  0   Discussed clinical findings with patient today.  Informed the patient that splintage of the fifth toe to the fourth toe as well as a surgical shoe and nonweightbearing if pain is severe is the correct  treatment plan at this time and she should continue to follow this for 3 to 4 weeks.  Will bring her back for a follow-up x-ray at that time.  Will send in an anti-inflammatory for the patient.  The patient did inquire specifically about receiving opioid analgesics for pain management.  She was informed that I do not write for prescription opioids for a pinky toe fracture.  She needs to elevate and ice and rest the foot.  She was offered a note for staying out of work if needed.  Due to the current postop shoe being too big and actually aggravating her pain she was switched to a size small surgical shoe which was a  perfect fit to her foot.  She noted immediate improvement of discomfort with the smaller surgical shoe.  Follow-up 4 weeks for x-ray   Clerance Lav, DPM, FACFAS [facility]     [address] [place], [place] [address]                Office [phone]  Fax [phone]

## 2023-04-08 ENCOUNTER — Other Ambulatory Visit (HOSPITAL_COMMUNITY): Payer: Self-pay

## 2023-04-08 MED ORDER — VYVANSE 30 MG PO CAPS
30.0000 mg | ORAL_CAPSULE | Freq: Every morning | ORAL | 0 refills | Status: DC
Start: 2023-04-08 — End: 2023-04-14
  Filled 2023-04-08: qty 30, 30d supply, fill #0

## 2023-04-09 ENCOUNTER — Other Ambulatory Visit (HOSPITAL_COMMUNITY): Payer: Self-pay

## 2023-04-22 ENCOUNTER — Other Ambulatory Visit (HOSPITAL_COMMUNITY): Payer: Self-pay

## 2023-04-22 ENCOUNTER — Other Ambulatory Visit (HOSPITAL_COMMUNITY): Payer: Self-pay | Admitting: Pharmacy Technician

## 2023-04-22 NOTE — Progress Notes (Signed)
Specialty Pharmacy Refill Coordination Note  Angel [name] is a 32 y.o. female contacted today regarding refills of specialty medication(s) Tezepelumab-Ekko   Patient requested Delivery   Delivery date: 05/04/23   Verified address: [address] [place]   Medication will be filled on 05/03/23.

## 2023-04-30 ENCOUNTER — Other Ambulatory Visit (HOSPITAL_COMMUNITY): Payer: Self-pay

## 2023-05-03 ENCOUNTER — Ambulatory Visit: Payer: Medicaid Other | Admitting: Podiatry

## 2023-05-03 ENCOUNTER — Ambulatory Visit (INDEPENDENT_AMBULATORY_CARE_PROVIDER_SITE_OTHER): Payer: Medicaid Other

## 2023-05-03 DIAGNOSIS — S92511D Displaced fracture of proximal phalanx of right lesser toe(s), subsequent encounter for fracture with routine healing: Secondary | ICD-10-CM | POA: Diagnosis not present

## 2023-05-03 DIAGNOSIS — S92501A Displaced unspecified fracture of right lesser toe(s), initial encounter for closed fracture: Secondary | ICD-10-CM

## 2023-05-03 NOTE — Progress Notes (Signed)
HPI: 32 y.o. female presents today for recheck of right fifth toe fracture.  She has been wearing her surgical shoe.  Which shows signs of moderate wear today.  Past Medical History:  Diagnosis Date   Anxiety    Asthma    Constipation     Past Surgical History:  Procedure Laterality Date   ADENOIDECTOMY     arm surgery     right   CESAREAN SECTION N/A 11/10/2015   Procedure: CESAREAN SECTION;  Surgeon: Marcelle Overlie, MD;  Location: [facility] BIRTHING SUITES;  Service: Obstetrics;  Laterality: N/A;   CESAREAN SECTION WITH BILATERAL TUBAL LIGATION N/A 07/05/2018   Procedure: REPEAT CESAREAN SECTION WITH BILATERAL TUBAL LIGATION;  Surgeon: Ranae Pila, MD;  Location: [facility] BIRTHING SUITES;  Service: Obstetrics;  Laterality: N/A;  Repeat edc 07/12/18 allergy to cefzil, vancomycin, augmentin Tracey RNFA   HERNIA REPAIR     umbilical   TUBAL LIGATION     VENTRAL HERNIA REPAIR N/A 12/22/2018   Procedure: LAPAROSCOPIC VENTRAL HERNIA REPAIR WITH MESH;  Surgeon: Darnell Level, MD;  Location: [facility];  Service: General;  Laterality: N/A;    Allergies  Allergen Reactions   Cefzil [Cefprozil] Shortness Of Breath and Swelling   Levofloxacin     Other Reaction(s): Other (See Comments)  Mucocutaneous reaction--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 No Did it involve sudden or severe rash/hives, skin peeling, or any reaction on the inside of your mouth or nose? Yes Did you need to seek medical attention at a hospital or doctor's office? Yes When did it last happen? 2012       If all above answers are "NO", may proceed with cephalosporin use.   Other Rash    Dermabond   Vancomycin Itching and Rash   Vancomycin Rash    Physical Exam: Palpable pedal pulses right foot.  Right fifth toe has no edema or ecchymosis.  There are no open lesions.   There is no pain on palpation to the fifth toe.  No pain with range of motion of the MPJ or PIPJ.  Toe is in good position  Radiographic Exam (right foot, 3 weightbearing views, 05/03/2023):  Normal osseous mineralization. Joint spaces preserved.  The cortical portion of the fracture has healed on x-ray.  There is a small area of lucency in the central portion but still indicates excellent healing and is in anatomic position.  Assessment/Plan of Care: 1. Closed displaced fracture of proximal phalanx of lesser toe of right foot with routine healing, subsequent encounter     Discussed clinical and radiographic findings with patient today.  Patient may start weaning back into regular shoe.  Did show her how to apply a Coban to buddy strap the fourth and fifth toes together for additional support while transferring back to regular shoe gear.  At this point we can follow-up as needed.   Clerance Lav, DPM, FACFAS [facility]     [address], [place] [address]                Office (  [phone]  Fax [phone]

## 2023-05-24 ENCOUNTER — Other Ambulatory Visit: Payer: Self-pay

## 2023-05-24 NOTE — Progress Notes (Signed)
Specialty Pharmacy Refill Coordination Note  Angel [name] is a 32 y.o. female contacted today regarding refills of specialty medication(s) Tezepelumab-Ekko   Patient requested Delivery   Delivery date: 06/01/23   Verified address: [address] [place]   Medication will be filled on 05/31/23.

## 2023-05-31 ENCOUNTER — Other Ambulatory Visit (HOSPITAL_COMMUNITY): Payer: Self-pay

## 2023-05-31 ENCOUNTER — Other Ambulatory Visit: Payer: Self-pay

## 2023-06-22 ENCOUNTER — Other Ambulatory Visit: Payer: Self-pay

## 2023-06-22 NOTE — Progress Notes (Signed)
Specialty Pharmacy Refill Coordination Note  Angel [name] is a 32 y.o. female contacted today regarding refills of specialty medication(s) [name]   Patient requested Delivery   Delivery date: 06/29/23   Verified address: [address] [place]   Medication will be filled on 06/28/23.

## 2023-06-28 ENCOUNTER — Other Ambulatory Visit: Payer: Self-pay

## 2023-07-20 ENCOUNTER — Other Ambulatory Visit: Payer: Self-pay

## 2023-07-20 NOTE — Progress Notes (Signed)
 Specialty Pharmacy Refill Coordination Note  Angel [name] is a 33 y.o. female contacted today regarding refills of specialty medication(s) Tezepelumab -ekko (Tezspire )   Patient requested Delivery   Delivery date: 07/27/23   Verified address: [address] [place]   Medication will be filled on 01.20.25.

## 2023-07-23 ENCOUNTER — Other Ambulatory Visit: Payer: Self-pay

## 2023-07-26 ENCOUNTER — Other Ambulatory Visit: Payer: Self-pay

## 2023-08-05 ENCOUNTER — Telehealth: Payer: Self-pay | Admitting: Pharmacist

## 2023-08-05 NOTE — Telephone Encounter (Signed)
Submitted a Prior Authorization RENEWAL request to [facility] for TEZSPIRE via CoverMyMeds. Will update once we receive a response.  Key: [id number]

## 2023-08-06 ENCOUNTER — Other Ambulatory Visit (HOSPITAL_COMMUNITY): Payer: Self-pay

## 2023-08-06 NOTE — Telephone Encounter (Signed)
Received notification from [facility] regarding a prior authorization for TEZSPIRE. Authorization has been APPROVED from 08/05/2023 to 02/01/2024. Approval letter sent to scan center.  Patient can continue to fill through [facility]: [phone]   Authorization # [id number]  Chesley Mires, PharmD, MPH, BCPS, CPP Clinical Pharmacist (Rheumatology and Pulmonology)

## 2023-08-17 ENCOUNTER — Other Ambulatory Visit: Payer: Self-pay

## 2023-08-17 ENCOUNTER — Other Ambulatory Visit (HOSPITAL_COMMUNITY): Payer: Self-pay

## 2023-08-17 NOTE — Progress Notes (Signed)
Specialty Pharmacy Refill Coordination Note  Angel [name] is a 33 y.o. female contacted today regarding refills of specialty medication(s) [name]   Patient requested Delivery   Delivery date: 08/24/23   Verified address: [address] [place]   Medication will be filled on 08/23/23.

## 2023-08-17 NOTE — Progress Notes (Signed)
Specialty Pharmacy Ongoing Clinical Assessment Note  Angel [name] is a 33 y.o. female who is being followed by the specialty pharmacy service for RxSp Asthma/COPD   Patient's specialty medication(s) reviewed today: Tezepelumab-ekko (Tezspire)   Missed doses in the last 4 weeks: 0   Patient/Caregiver did not have any additional questions or concerns.   Therapeutic benefit summary: Patient is achieving benefit   Adverse events/side effects summary: No adverse events/side effects   Patient's therapy is appropriate to: Continue    Goals Addressed             This Visit's Progress    Reduce disease symptoms including coughing and shortness of breath       Patient is on track. Patient will maintain adherence         Follow up:  6 months  Otto Herb Specialty Pharmacist

## 2023-08-23 ENCOUNTER — Other Ambulatory Visit: Payer: Self-pay

## 2023-09-02 ENCOUNTER — Other Ambulatory Visit (HOSPITAL_COMMUNITY): Payer: Self-pay

## 2023-09-14 ENCOUNTER — Other Ambulatory Visit: Payer: Self-pay

## 2023-09-16 ENCOUNTER — Other Ambulatory Visit: Payer: Self-pay | Admitting: Pulmonary Disease

## 2023-09-16 ENCOUNTER — Other Ambulatory Visit: Payer: Self-pay

## 2023-09-16 ENCOUNTER — Other Ambulatory Visit (HOSPITAL_COMMUNITY): Payer: Self-pay

## 2023-09-16 DIAGNOSIS — J4550 Severe persistent asthma, uncomplicated: Secondary | ICD-10-CM

## 2023-09-16 MED ORDER — TEZSPIRE 210 MG/1.91ML ~~LOC~~ SOAJ
210.0000 mg | SUBCUTANEOUS | 2 refills | Status: DC
  Filled 2023-09-16: qty 1.91, 28d supply, fill #0
  Filled 2023-10-15: qty 1.91, 28d supply, fill #1
  Filled 2023-11-15: qty 1.91, 28d supply, fill #2

## 2023-09-16 NOTE — Progress Notes (Signed)
 Specialty Pharmacy Refill Coordination Note  Angel [name] is a 33 y.o. female contacted today regarding refills of specialty medication(s) [name]   Patient requested Delivery   Delivery date: 09/21/23   Verified address: [address] [place]   Medication will be filled on 09/20/23.

## 2023-09-20 ENCOUNTER — Other Ambulatory Visit: Payer: Self-pay

## 2023-10-13 ENCOUNTER — Other Ambulatory Visit: Payer: Self-pay

## 2023-10-15 ENCOUNTER — Other Ambulatory Visit: Payer: Self-pay

## 2023-10-15 NOTE — Progress Notes (Signed)
 Specialty Pharmacy Refill Coordination Note  Angel [name] is a 33 y.o. female contacted today regarding refills of specialty medication(s) [name]   Patient requested Delivery   Delivery date: 10/20/23   Verified address: [address] [place]   Medication will be filled on 10/19/23.

## 2023-10-19 ENCOUNTER — Other Ambulatory Visit: Payer: Self-pay

## 2023-11-08 ENCOUNTER — Other Ambulatory Visit: Payer: Self-pay

## 2023-11-12 ENCOUNTER — Other Ambulatory Visit: Payer: Self-pay

## 2023-11-15 ENCOUNTER — Other Ambulatory Visit: Payer: Self-pay

## 2023-11-15 ENCOUNTER — Other Ambulatory Visit: Payer: Self-pay | Admitting: Pharmacy Technician

## 2023-11-15 NOTE — Progress Notes (Signed)
 Specialty Pharmacy Refill Coordination Note  Angel [name] is a 33 y.o. female contacted today regarding refills of specialty medication(s) Tezepelumab -ekko (Tezspire )   Patient requested Delivery   Delivery date: 11/17/23   Verified address: [address]  [place]  **Address house number changed**   Medication will be filled on 11/16/23.

## 2023-11-16 ENCOUNTER — Other Ambulatory Visit: Payer: Self-pay

## 2023-12-07 ENCOUNTER — Other Ambulatory Visit: Payer: Self-pay

## 2023-12-07 ENCOUNTER — Telehealth: Payer: Self-pay | Admitting: Pulmonary Disease

## 2023-12-07 DIAGNOSIS — J4550 Severe persistent asthma, uncomplicated: Secondary | ICD-10-CM

## 2023-12-07 NOTE — Progress Notes (Signed)
 Specialty Pharmacy Refill Coordination Note  Angel [name] is a 32 y.o. female contacted today regarding refills of specialty medication(s) Tezepelumab -ekko (Tezspire )   Patient requested Delivery   Delivery date: 12/15/23   Verified address: [address] [place] [address]   Medication will be filled on 12/14/23. This fill date is pending response to refill request from provider. Patient is aware and if they have not received fill by intended date they must follow up with pharmacy.

## 2023-12-08 ENCOUNTER — Other Ambulatory Visit: Payer: Self-pay

## 2023-12-08 MED ORDER — TEZSPIRE 210 MG/1.91ML ~~LOC~~ SOAJ
210.0000 mg | SUBCUTANEOUS | 1 refills | Status: DC
  Filled 2023-12-08: qty 1.91, 28d supply, fill #0

## 2023-12-08 NOTE — Telephone Encounter (Signed)
 Refill sent for TEZSPIRE  to [facility]: [phone]   Dose: 210mg  subcut every 4 weeks  Last OV: 01/21/2023 Provider: former Dr. Thelda Finney patient  Next OV: not scheduled and due in July 2025  Routing to scheduling team for follow-up on appt scheduling  Geraldene Kleine, PharmD, MPH, BCPS Clinical Pharmacist (Rheumatology and Pulmonology)

## 2023-12-14 ENCOUNTER — Other Ambulatory Visit: Payer: Self-pay

## 2023-12-17 ENCOUNTER — Telehealth (INDEPENDENT_AMBULATORY_CARE_PROVIDER_SITE_OTHER): Admitting: Adult Health

## 2023-12-17 ENCOUNTER — Other Ambulatory Visit: Payer: Self-pay

## 2023-12-17 ENCOUNTER — Other Ambulatory Visit (HOSPITAL_COMMUNITY): Payer: Self-pay

## 2023-12-17 ENCOUNTER — Encounter: Payer: Self-pay | Admitting: Adult Health

## 2023-12-17 DIAGNOSIS — J4550 Severe persistent asthma, uncomplicated: Secondary | ICD-10-CM | POA: Diagnosis not present

## 2023-12-17 DIAGNOSIS — Z87891 Personal history of nicotine dependence: Secondary | ICD-10-CM

## 2023-12-17 MED ORDER — ALBUTEROL SULFATE HFA 108 (90 BASE) MCG/ACT IN AERS: 1.0000 | INHALATION_SPRAY | Freq: Four times a day (QID) | RESPIRATORY_TRACT | 4 refills | Status: AC | PRN

## 2023-12-17 MED ORDER — TEZSPIRE 210 MG/1.91ML ~~LOC~~ SOAJ
210.0000 mg | SUBCUTANEOUS | 11 refills | Status: DC
  Filled 2023-12-17 – 2024-01-05 (×2): qty 1.91, 28d supply, fill #0
  Filled 2024-02-03: qty 1.91, 28d supply, fill #1
  Filled 2024-02-29: qty 1.91, 28d supply, fill #2
  Filled 2024-03-31: qty 1.91, 28d supply, fill #3
  Filled 2024-04-24 – 2024-04-26 (×2): qty 1.91, 28d supply, fill #4
  Filled 2024-05-17: qty 1.91, 28d supply, fill #5
  Filled 2024-06-19: qty 1.91, 28d supply, fill #6
  Filled 2024-07-17 – 2024-07-21 (×2): qty 1.91, 28d supply, fill #7

## 2023-12-17 MED ORDER — INCRUSE ELLIPTA 62.5 MCG/ACT IN AEPB: 1.0000 | INHALATION_SPRAY | Freq: Every day | RESPIRATORY_TRACT | 11 refills | Status: AC

## 2023-12-17 MED ORDER — FLUTICASONE-SALMETEROL 230-21 MCG/ACT IN AERO: 2.0000 | INHALATION_SPRAY | Freq: Two times a day (BID) | RESPIRATORY_TRACT | 11 refills | Status: AC

## 2023-12-17 NOTE — Progress Notes (Signed)
 Virtual Visit via Video Note  I connected with Angel [name] on 12/17/23 at  1:30 PM EDT by a video enabled telemedicine application and verified that I am speaking with the correct person using two identifiers.  Location: Patient: Home  Provider: Office    I discussed the limitations of evaluation and management by telemedicine and the availability of in person appointments. The patient expressed understanding and agreed to proceed.  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  Patient has severe persistent asthma with allergic phenotype.  She is on a maintenance regimen with Incruse and Advair .  She is on Tezspire  monthly injections.  Overall says her breathing is doing she denies any flare of cough or wheezing.  No increased albuterol  use, rare use.  She is on birth control and previous tubal ligation-denies pregnancy. No recent exacerbations. No steroid use in last 6 months.  She is working on weight loss, lost 20lbs on [place]gov[place].  Remains active, has small kids.  Needs refills sent to pharmacy .  No previous PFT on record.   Past Medical History:  Diagnosis Date   Anxiety    Asthma    Constipation        Observations/Objective: Rast panel- IgE 51, Ab Eos 1400  12/17/2023 Appears well in NAD   Assessment and Plan: Severe persistent asthma with allergic phenotype.  Patient has excellent control and compliance on maintenance regimen with Advair  twice daily, Incruse daily and Tezspire  injections.  Refills were sent to her pharmacy per her request.  Will check PFTs on return visit next year.  Asthma action plan discussed.  Plan  Patient Instructions  Continue on Advair  2 puffs twice daily, rinse after use Continue on Incruse 1 puff daily Albuterol  inhaler as needed Continue on Tezspire  injections monthly Follow up in 1 year with PFT  with Dr. Dione Franks or Melburn Treiber NP -30 min slot       Follow Up Instructions: Follow-up in 1 year with PFTs   I discussed the assessment and treatment plan with the patient. The patient was provided an opportunity to ask questions and all were answered. The patient agreed with the plan and demonstrated an understanding of the instructions.   The patient was advised to call back or seek an in-person evaluation if the symptoms worsen or if the condition fails to improve as anticipated.  I provided 21  minutes of non-face-to-face time during this encounter.   Roena Clark, NP

## 2023-12-17 NOTE — Patient Instructions (Addendum)
 Continue on Advair  2 puffs twice daily, rinse after use Continue on Incruse 1 puff daily Albuterol  inhaler as needed Continue on Tezspire  injections monthly Follow up in 1 year with PFT with Dr. Dione Franks or Aaren Krog NP -30 min slot

## 2024-01-05 ENCOUNTER — Other Ambulatory Visit (HOSPITAL_COMMUNITY): Payer: Self-pay

## 2024-01-05 ENCOUNTER — Other Ambulatory Visit: Payer: Self-pay

## 2024-01-05 ENCOUNTER — Other Ambulatory Visit: Payer: Self-pay | Admitting: Pharmacy Technician

## 2024-01-05 NOTE — Progress Notes (Signed)
 Specialty Pharmacy Refill Coordination Note  Angel [name] is a 33 y.o. female contacted today regarding refills of specialty medication(s) Tezepelumab -ekko (Tezspire )   Patient requested Delivery   Delivery date: 01/11/24   Verified address: [address]  [place] [address]   Medication will be filled on 01/10/24.

## 2024-01-10 ENCOUNTER — Other Ambulatory Visit: Payer: Self-pay

## 2024-01-10 ENCOUNTER — Telehealth: Payer: Self-pay | Admitting: Pharmacist

## 2024-01-10 NOTE — Telephone Encounter (Signed)
 Submitted a Prior Authorization RENEWAL request to [facility] for TEZSPIRE  via CoverMyMeds. Will update once we receive a response.  Key: [id number]

## 2024-01-11 NOTE — Telephone Encounter (Signed)
 Received notification from [facility] regarding a prior authorization for TEZSPIRE . Authorization has been APPROVED from 01/10/2024 to 07/08/2024. Approval letter sent to scan center.  Authorization # [id number]  Sherry Pennant, PharmD, MPH, BCPS, CPP Clinical Pharmacist (Rheumatology and Pulmonology)

## 2024-02-03 ENCOUNTER — Other Ambulatory Visit (HOSPITAL_COMMUNITY): Payer: Self-pay

## 2024-02-03 ENCOUNTER — Other Ambulatory Visit: Payer: Self-pay

## 2024-02-03 NOTE — Progress Notes (Signed)
 Specialty Pharmacy Refill Coordination Note  Spoke with Briella Hobday is a 33 y.o. female contacted today regarding refills of specialty medication(s) Tezepelumab -ekko (Tezspire )  Doses on hand: 0  Injection date: 02/11/24   Patient requested: Delivery   Delivery date: 02/08/24   Verified address: [address]  [place] [address]  Medication will be filled on 02/07/24.

## 2024-02-04 ENCOUNTER — Other Ambulatory Visit: Payer: Self-pay

## 2024-02-07 ENCOUNTER — Other Ambulatory Visit: Payer: Self-pay

## 2024-02-25 ENCOUNTER — Other Ambulatory Visit (HOSPITAL_COMMUNITY): Payer: Self-pay

## 2024-02-29 ENCOUNTER — Other Ambulatory Visit: Payer: Self-pay

## 2024-03-02 ENCOUNTER — Other Ambulatory Visit: Payer: Self-pay | Admitting: Pharmacy Technician

## 2024-03-02 ENCOUNTER — Other Ambulatory Visit: Payer: Self-pay

## 2024-03-02 NOTE — Progress Notes (Signed)
 Specialty Pharmacy Refill Coordination Note  Angel [name] is a 33 y.o. female contacted today regarding refills of specialty medication(s) Tezepelumab -ekko (Tezspire )   Patient requested Delivery   Delivery date: 03/08/24   Verified address: [address]  [place] [address]   Medication will be filled on 03/07/24. Injection due on 03/10/24.

## 2024-03-07 ENCOUNTER — Other Ambulatory Visit: Payer: Self-pay

## 2024-03-17 ENCOUNTER — Other Ambulatory Visit: Payer: Self-pay

## 2024-03-17 ENCOUNTER — Other Ambulatory Visit (HOSPITAL_COMMUNITY): Payer: Self-pay

## 2024-03-17 MED ORDER — METHYLPHENIDATE HCL ER (OSM) 36 MG PO TBCR
36.0000 mg | EXTENDED_RELEASE_TABLET | Freq: Every morning | ORAL | 0 refills | Status: AC
Start: 2024-03-17 — End: ?
  Filled 2024-03-17: qty 30, 30d supply, fill #0

## 2024-03-21 ENCOUNTER — Other Ambulatory Visit (HOSPITAL_COMMUNITY): Payer: Self-pay

## 2024-03-28 ENCOUNTER — Other Ambulatory Visit: Payer: Self-pay

## 2024-03-31 ENCOUNTER — Other Ambulatory Visit (HOSPITAL_COMMUNITY): Payer: Self-pay

## 2024-03-31 ENCOUNTER — Other Ambulatory Visit: Payer: Self-pay

## 2024-03-31 NOTE — Progress Notes (Signed)
 Specialty Pharmacy Refill Coordination Note  Angel [name] is a 33 y.o. female contacted today regarding refills of specialty medication(s) Tezepelumab -ekko (Tezspire )   Patient requested Delivery   Delivery date: 04/04/24   Verified address: [address]  [place] [address]   Medication will be filled on 04/03/24.

## 2024-04-03 ENCOUNTER — Other Ambulatory Visit: Payer: Self-pay

## 2024-04-04 ENCOUNTER — Other Ambulatory Visit: Payer: Self-pay

## 2024-04-24 ENCOUNTER — Other Ambulatory Visit (HOSPITAL_COMMUNITY): Payer: Self-pay

## 2024-04-26 ENCOUNTER — Other Ambulatory Visit (HOSPITAL_COMMUNITY): Payer: Self-pay

## 2024-04-26 ENCOUNTER — Other Ambulatory Visit: Payer: Self-pay

## 2024-04-26 NOTE — Progress Notes (Signed)
 Specialty Pharmacy Refill Coordination Note  Spoke with Angel [name] is a 33 y.o. female contacted today regarding refills of specialty medication(s) Tezepelumab -ekko (Tezspire )  Doses on hand: 0  Injection date: 05/05/24   Patient requested: Delivery   Delivery date: 05/02/24   Verified address: [address] [place] [address]  Medication will be filled on 05/01/24.

## 2024-05-01 ENCOUNTER — Other Ambulatory Visit: Payer: Self-pay

## 2024-05-16 ENCOUNTER — Other Ambulatory Visit: Payer: Self-pay

## 2024-05-17 ENCOUNTER — Other Ambulatory Visit: Payer: Self-pay

## 2024-05-17 NOTE — Progress Notes (Signed)
 Specialty Pharmacy Refill Coordination Note  Shari Natt is a 33 y.o. female contacted today regarding refills of specialty medication(s) Tezepelumab -ekko (Tezspire )   Patient requested Delivery   Delivery date: 05/30/24   Verified address: [address] [place] [address]   Medication will be filled on: 05/29/24

## 2024-05-17 NOTE — Progress Notes (Signed)
 Specialty Pharmacy Ongoing Clinical Assessment Note  Angel [name] is a 33 y.o. female who is being followed by the specialty pharmacy service for RxSp Asthma/COPD   Patient's specialty medication(s) reviewed today: Tezepelumab -ekko (Tezspire )   Missed doses in the last 4 weeks: 0   Patient/Caregiver did not have any additional questions or concerns.   Therapeutic benefit summary: Patient is achieving benefit   Adverse events/side effects summary: No adverse events/side effects   Patient's therapy is appropriate to: Continue    Goals Addressed             This Visit's Progress    Reduce disease symptoms including coughing and shortness of breath   On track    Patient is on track. Patient will maintain adherence         Follow up: 12 months  Taisei Bonnette M Fernado Brigante Specialty Pharmacist

## 2024-06-12 ENCOUNTER — Telehealth: Payer: Self-pay

## 2024-06-12 DIAGNOSIS — J4550 Severe persistent asthma, uncomplicated: Secondary | ICD-10-CM

## 2024-06-12 NOTE — Telephone Encounter (Signed)
 Submitted a Prior Authorization request to [employer] for TEZSPIRE  via CoverMyMeds. Authorization has been APPROVED from 06/12/24 to 12/09/24. Approval letter sent to scan center.   Patient can continue to fill through [facility]: [phone]   Authorization#: [id number] Phone#: [phone]

## 2024-06-19 ENCOUNTER — Other Ambulatory Visit: Payer: Self-pay

## 2024-06-21 ENCOUNTER — Other Ambulatory Visit (HOSPITAL_COMMUNITY): Payer: Self-pay

## 2024-06-21 ENCOUNTER — Other Ambulatory Visit: Payer: Self-pay

## 2024-06-21 NOTE — Progress Notes (Signed)
 Specialty Pharmacy Refill Coordination Note  Spoke with Angel [name] is a 33 y.o. female contacted today regarding refills of specialty medication(s) Tezepelumab -ekko (Tezspire )  Doses on hand: 0  Next inj: 06/30/24   Patient requested: Delivery   Delivery date: 06/27/24   Verified address: [address] [place] [address]  Medication will be filled on 06/26/24

## 2024-07-17 ENCOUNTER — Other Ambulatory Visit: Payer: Self-pay

## 2024-07-19 ENCOUNTER — Other Ambulatory Visit (HOSPITAL_COMMUNITY): Payer: Self-pay

## 2024-07-21 ENCOUNTER — Other Ambulatory Visit: Payer: Self-pay

## 2024-07-21 ENCOUNTER — Other Ambulatory Visit: Payer: Self-pay | Admitting: Pharmacy Technician

## 2024-07-21 NOTE — Progress Notes (Signed)
 Specialty Pharmacy Refill Coordination Note  Angel [name] is a 34 y.o. female contacted today regarding refills of specialty medication(s) Tezepelumab -ekko (Tezspire )   Patient requested Delivery   Delivery date: 07/25/24   Verified address: [address]   [place]   Medication will be filled on: 07/24/24

## 2024-08-04 NOTE — Addendum Note (Signed)
 Addended by: Savahanna Almendariz L on: 08/04/2024 01:25 PM   Modules accepted: Orders

## 2024-08-08 ENCOUNTER — Other Ambulatory Visit: Payer: Self-pay

## 2024-08-11 ENCOUNTER — Other Ambulatory Visit: Payer: Self-pay

## 2024-08-11 ENCOUNTER — Ambulatory Visit

## 2024-08-11 ENCOUNTER — Other Ambulatory Visit (HOSPITAL_COMMUNITY): Payer: Self-pay

## 2024-08-11 DIAGNOSIS — Z79899 Other long term (current) drug therapy: Secondary | ICD-10-CM

## 2024-08-11 DIAGNOSIS — J4550 Severe persistent asthma, uncomplicated: Secondary | ICD-10-CM

## 2024-08-11 MED ORDER — TEZSPIRE 210 MG/1.91ML ~~LOC~~ SOAJ
210.0000 mg | SUBCUTANEOUS | 3 refills | Status: AC
  Filled 2024-08-11: qty 1.91, 28d supply, fill #0

## 2024-08-11 NOTE — Progress Notes (Signed)
 Specialty Pharmacy Refill Coordination Note  Spoke with Angel [name] is a 34 y.o. female contacted today regarding refills of specialty medication(s) Tezepelumab -ekko (Tezspire )  Doses on hand: 0  Next inj: 08/25/24   Patient requested: Delivery   Delivery date: 08/22/24   Verified address: [address] [place] [address]  Medication will be filled on 08/21/24

## 2024-08-11 NOTE — Progress Notes (Cosign Needed)
 [facility] - Continuation of Therapy with Biologic  Referring Provider: Madelin Stank (received verbal authorization for referral  Virtual Visit via Telephone Note  I connected with Waddell Earnie Buff on 08/11/24 at  4:50 PM EST by telephone and verified that I am speaking with the correct person using two identifiers.  Location: Patient: home Provider: office   I discussed the limitations, risks, security and privacy concerns of performing an evaluation and management service by telephone and the availability of in person appointments. I also discussed with the patient that there may be a patient responsible charge related to this service. The patient expressed understanding and agreed to proceed.  HPI: Angel [name] is a 34 y.o. female who presents to the pharmacotherapy clinic via telephone for continuation of therapy with Tezspire . Last OV with Tammy Parrett was on 12/17/23.   Indication: asthma Dosing: 210mg  every 28 days  Patient's current respiratory regimen: Incruse and Advair   Patient Active Problem List   Diagnosis Date Noted   Low serum vitamin D 01/28/2023   Fatty liver disease, nonalcoholic 01/28/2022   Asthma 11/12/2021   Constipation 11/12/2021   Class 1 obesity due to excess calories with serious comorbidity in adult 06/16/2020   Iron deficiency 06/04/2019   Moderate persistent asthma without complication 02/20/2019   Ventral incisional hernia 12/22/2018   Current mild episode of major depressive disorder without prior episode 11/10/2018   Ventral hernia 09/18/2018   Pregnant 07/05/2018   ADHD (attention deficit hyperactivity disorder) 12/31/2015   Allergic rhinitis 12/31/2015   Anxiety 12/31/2015   Preterm contractions 11/10/2015   S/P cesarean section 11/10/2015    Patient's Medications  New Prescriptions   No medications on file  Previous Medications   ALBUTEROL  (VENTOLIN  HFA) 108 (90 BASE) MCG/ACT INHALER    Inhale  1-2 puffs into the lungs every 6 (six) hours as needed for wheezing or shortness of breath.   FLUTICASONE -SALMETEROL (ADVAIR  HFA) 230-21 MCG/ACT INHALER    Inhale 2 puffs into the lungs 2 (two) times daily.   IBUPROFEN  (ADVIL ) 600 MG TABLET    Take 1 tablet (600 mg total) by mouth every 6 (six) hours as needed.   JUNEL FE 1.5/30 1.5-30 MG-MCG TABLET    Take 1 tablet by mouth daily.   LISDEXAMFETAMINE  (VYVANSE ) 30 MG CAPSULE    Take 30 mg by mouth daily.   METHYLPHENIDATE  36 MG PO CR TABLET    Take 1 tablet (36 mg total) by mouth in the morning.   POLYETHYLENE GLYCOL (MIRALAX  / GLYCOLAX ) PACKET    Take 17 g by mouth daily as needed for mild constipation.    UMECLIDINIUM BROMIDE  (INCRUSE ELLIPTA ) 62.5 MCG/ACT AEPB    Inhale 1 puff into the lungs daily.  Modified Medications   Modified Medication Previous Medication   TEZEPELUMAB -EKKO (TEZSPIRE ) 210 MG/1. SOAJ Tezepelumab -ekko (TEZSPIRE ) 210 MG/1. SOAJ      Inject 210 mg into the skin every 28 (twenty-eight) days.    Inject 210 mg into the skin every 28 (twenty-eight) days.  Discontinued Medications   No medications on file    Allergies: Allergies[1]  Past Medical History: Past Medical History:  Diagnosis Date   Anxiety    Asthma    Constipation     Social History: Social History   Socioeconomic History   Marital status: Married    Spouse name: Not on file   Number of children: Not on file   Years of education: Not on file   Highest education level:  Not on file  Occupational History   Not on file  Tobacco Use   Smoking status: Former    Current packs/day: 1.00    Average packs/day: 1 pack/day for 3.0 years (3.0 ttl pk-yrs)    Types: Cigarettes   Smokeless tobacco: Never   Tobacco comments:    quit 2017  Vaping Use   Vaping status: Never Used  Substance and Sexual Activity   Alcohol use: Yes    Alcohol/week: 1.0 standard drink of alcohol    Types: 1 Glasses of wine per week    Comment: Rare   Drug use: No    Sexual activity: Yes    Birth control/protection: None  Other Topics Concern   Not on file  Social History Narrative   Not on file   Social Drivers of Health   Tobacco Use: Medium Risk (07/12/2024)   Received from [facility]   Patient History    Smoking Tobacco Use: Former    Smokeless Tobacco Use: Never    Passive Exposure: Past  Physicist, Medical Strain: Not on file  Food Insecurity: Low Risk (06/23/2024)   Received from [facility]   Epic    Within the past 12 months, you worried that your food would run out before you got money to buy more: Never true    Within the past 12 months, the food you bought just didn't last and you didn't have money to get more. : Never true  Transportation Needs: No Transportation Needs (06/23/2024)   Received from [facility]    In the past 12 months, has lack of reliable transportation kept you from medical appointments, meetings, work or from getting things needed for daily living? : No  Physical Activity: Not on file  Stress: Not on file  Social Connections: Not on file  Depression (EYV7-0): Not on file  Alcohol Screen: Not on file  Housing: Low Risk (06/23/2024)   Received from [facility]   Epic    What is your living situation today?: I have a steady place to live    Think about the place you live. Do you have problems with any of the following? Choose all that apply:: None/None on this list  Utilities: Low Risk (06/23/2024)   Received from [facility]   Utilities    In the past 12 months has the electric, gas, oil, or water company threatened to shut off services in your home? : No  Health Literacy: Not on file       Assessment/Plan: 1. Patient prescribed Tezspire  for asthma. Reviewed the medication with the patient, including the following:   Goals of therapy: Mechanism: human monoclonal IgG2? antibody that binds to TSLP. This blocks TSLP from its effect on inflammation including reduce eosinophils,  IgE, FeNO, IL-5, and IL-13. Mechanism is not definitively established. Reviewed that Tezspire  is add-on medication and patient must continue maintenance inhaler regimen. Response to therapy: may take 3-4 months to determine efficacy.  Side effects: injection site reaction (6-18%), antibody development (2%), arthralgia (4%), back pain (4%), pharyngitis (4%)  Dose: Tezspire  210 mg once every 4 weeks  Administration/Storage:  Reviewed administration sites of thigh or abdomen (at least 2-3 inches away from abdomen). Reviewed the upper arm is only appropriate if caregiver is administering injection  Do not shake pen/syringe as this could lead to product foaming or precipitation. Do not shake syringe as this could lead to product foaming or precipitation.   Plan:  - Patient will continue injections  every 2 weeks at home.  - Rx will be triaged to [facility] for delivery to patient home.   I discussed the assessment and treatment plan with the patient. The patient was provided an opportunity to ask questions and all were answered. The patient agreed with the plan and demonstrated an understanding of the instructions.   The patient was advised to call back or seek an in-person evaluation if the symptoms worsen or if the condition fails to improve as anticipated.  I provided 10 minutes of non-face-to-face time during this encounter.  Patient verbalizes understanding and agreement with plan.   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 No Did it involve sudden or severe rash/hives, skin peeling, or any reaction on the inside of your mouth or nose? Yes Did you need to seek medical attention at a hospital or doctor's office? Yes When did it last happen? 2012       If all above answers are NO, may proceed with cephalosporin use.   Other Rash    Dermabond   Vancomycin Itching and Rash   Vancomycin Rash
